# Patient Record
Sex: Male | Born: 1950
Health system: Southern US, Community
[De-identification: ages and names within clinical notes are randomized; demographics above are authoritative.]

## PROBLEM LIST (undated history)

## (undated) DIAGNOSIS — D126 Benign neoplasm of colon, unspecified: Secondary | ICD-10-CM

## (undated) DIAGNOSIS — E785 Hyperlipidemia, unspecified: Secondary | ICD-10-CM

## (undated) DIAGNOSIS — K2980 Duodenitis without bleeding: Secondary | ICD-10-CM

## (undated) DIAGNOSIS — F329 Major depressive disorder, single episode, unspecified: Secondary | ICD-10-CM

## (undated) DIAGNOSIS — F32A Depression, unspecified: Secondary | ICD-10-CM

## (undated) DIAGNOSIS — J45909 Unspecified asthma, uncomplicated: Secondary | ICD-10-CM

## (undated) DIAGNOSIS — Z9889 Other specified postprocedural states: Secondary | ICD-10-CM

## (undated) DIAGNOSIS — C4441 Basal cell carcinoma of skin of scalp and neck: Secondary | ICD-10-CM

## (undated) DIAGNOSIS — C61 Malignant neoplasm of prostate: Secondary | ICD-10-CM

## (undated) DIAGNOSIS — K449 Diaphragmatic hernia without obstruction or gangrene: Secondary | ICD-10-CM

## (undated) DIAGNOSIS — T7840XA Allergy, unspecified, initial encounter: Secondary | ICD-10-CM

## (undated) DIAGNOSIS — K222 Esophageal obstruction: Secondary | ICD-10-CM

## (undated) DIAGNOSIS — K5790 Diverticulosis of intestine, part unspecified, without perforation or abscess without bleeding: Secondary | ICD-10-CM

## (undated) DIAGNOSIS — Z8719 Personal history of other diseases of the digestive system: Secondary | ICD-10-CM

## (undated) HISTORY — PX: COLONOSCOPY: SHX174

## (undated) HISTORY — DX: Diaphragmatic hernia without obstruction or gangrene: K44.9

## (undated) HISTORY — DX: Esophageal obstruction: K22.2

## (undated) HISTORY — DX: Hyperlipidemia, unspecified: E78.5

## (undated) HISTORY — PX: TONSILLECTOMY: SUR1361

## (undated) HISTORY — DX: Unspecified asthma, uncomplicated: J45.909

## (undated) HISTORY — DX: Duodenitis without bleeding: K29.80

## (undated) HISTORY — DX: Diverticulosis of intestine, part unspecified, without perforation or abscess without bleeding: K57.90

## (undated) HISTORY — DX: Basal cell carcinoma of skin of scalp and neck: C44.41

## (undated) HISTORY — DX: Depression, unspecified: F32.A

## (undated) HISTORY — DX: Personal history of other diseases of the digestive system: Z87.19

## (undated) HISTORY — DX: Benign neoplasm of colon, unspecified: D12.6

## (undated) HISTORY — DX: Major depressive disorder, single episode, unspecified: F32.9

## (undated) HISTORY — DX: Other specified postprocedural states: Z98.890

## (undated) HISTORY — DX: Malignant neoplasm of prostate: C61

## (undated) HISTORY — DX: Allergy, unspecified, initial encounter: T78.40XA

---

## 2000-02-27 ENCOUNTER — Encounter: Payer: Self-pay | Admitting: Family Medicine

## 2000-02-27 ENCOUNTER — Encounter: Admission: RE | Admit: 2000-02-27 | Discharge: 2000-02-27 | Payer: Self-pay | Admitting: Family Medicine

## 2000-07-16 ENCOUNTER — Encounter: Payer: Self-pay | Admitting: Family Medicine

## 2000-07-16 ENCOUNTER — Encounter: Admission: RE | Admit: 2000-07-16 | Discharge: 2000-07-16 | Payer: Self-pay | Admitting: Family Medicine

## 2009-06-29 DIAGNOSIS — C61 Malignant neoplasm of prostate: Secondary | ICD-10-CM

## 2009-06-29 HISTORY — DX: Malignant neoplasm of prostate: C61

## 2009-07-30 HISTORY — PX: PROSTATECTOMY: SHX69

## 2013-04-24 ENCOUNTER — Ambulatory Visit (INDEPENDENT_AMBULATORY_CARE_PROVIDER_SITE_OTHER): Payer: BC Managed Care – PPO

## 2013-04-24 DIAGNOSIS — Z23 Encounter for immunization: Secondary | ICD-10-CM

## 2013-05-18 ENCOUNTER — Telehealth: Payer: Self-pay | Admitting: *Deleted

## 2013-05-18 NOTE — Telephone Encounter (Signed)
Pt aware that b12 levels have to be drawn and he will decide if he wants to do it or not and he will call us back

## 2013-07-12 ENCOUNTER — Ambulatory Visit (INDEPENDENT_AMBULATORY_CARE_PROVIDER_SITE_OTHER): Payer: BC Managed Care – PPO

## 2013-07-12 ENCOUNTER — Ambulatory Visit (INDEPENDENT_AMBULATORY_CARE_PROVIDER_SITE_OTHER): Payer: BC Managed Care – PPO | Admitting: Family Medicine

## 2013-07-12 ENCOUNTER — Encounter: Payer: Self-pay | Admitting: Family Medicine

## 2013-07-12 VITALS — BP 123/80 | HR 63 | Temp 97.4°F | Ht 73.0 in | Wt 211.0 lb

## 2013-07-12 DIAGNOSIS — F329 Major depressive disorder, single episode, unspecified: Secondary | ICD-10-CM

## 2013-07-12 DIAGNOSIS — F339 Major depressive disorder, recurrent, unspecified: Secondary | ICD-10-CM | POA: Insufficient documentation

## 2013-07-12 DIAGNOSIS — Z0000 Encounter for general adult medical examination without abnormal findings: Secondary | ICD-10-CM

## 2013-07-12 DIAGNOSIS — R030 Elevated blood-pressure reading, without diagnosis of hypertension: Secondary | ICD-10-CM | POA: Insufficient documentation

## 2013-07-12 DIAGNOSIS — F32A Depression, unspecified: Secondary | ICD-10-CM

## 2013-07-12 DIAGNOSIS — Z801 Family history of malignant neoplasm of trachea, bronchus and lung: Secondary | ICD-10-CM

## 2013-07-12 DIAGNOSIS — E785 Hyperlipidemia, unspecified: Secondary | ICD-10-CM | POA: Insufficient documentation

## 2013-07-12 DIAGNOSIS — IMO0001 Reserved for inherently not codable concepts without codable children: Secondary | ICD-10-CM

## 2013-07-12 DIAGNOSIS — Z8546 Personal history of malignant neoplasm of prostate: Secondary | ICD-10-CM | POA: Insufficient documentation

## 2013-07-12 NOTE — Patient Instructions (Addendum)
Continue current medications. Continue good therapeutic lifestyle changes which include good diet and exercise. Fall precautions discussed with patient. Schedule your flu vaccine if you haven't had it yet If you are over 62 years old - you may need Prevnar 54 or the adult Pneumonia vaccine. Check on the cost of Zostavx (shingles shot) Also, check on the cost of Prevnar vaccine Continue with your exercise and diet habits Followup with the urologist as planned and come to this office and get your PSA prior to that visit

## 2013-07-12 NOTE — Progress Notes (Signed)
Subjective:    Patient ID: Spencer White, male    DOB: 1951-03-06, 63 y.o.   MRN: [id number]  HPI Patient here today for complete physical. This patient is doing well no specific complaints. He has a history of prostate cancer and will followup with the urologist in 4 months. He will return to get his blood work done a but the PSA will be relegated to the urologist. He will check with his insurance regarding the Prevnar. He will also be given an FOBT to return. We will do a chest x-ray today. His family history is positive and his brother and mother for lung cancer.       There are no active problems to display for this patient.  Outpatient Encounter Prescriptions as of 07/12/2013  Medication Sig  . venlafaxine XR (EFFEXOR-XR) 75 MG 24 hr capsule Take 75 mg by mouth daily with breakfast.    Review of Systems  Constitutional: Negative.   HENT: Negative.   Eyes: Negative.   Respiratory: Negative.   Cardiovascular: Negative.   Gastrointestinal: Negative.   Endocrine: Negative.   Genitourinary: Negative.   Musculoskeletal: Negative.   Skin: Negative.   Allergic/Immunologic: Negative.   Neurological: Negative.   Hematological: Negative.   Psychiatric/Behavioral: Negative.        Objective:   Physical Exam  Nursing note and vitals reviewed. Constitutional: He is oriented to person, place, and time. He appears well-developed and well-nourished. No distress.  HENT:  Head: Normocephalic and atraumatic.  Right Ear: External ear normal.  Left Ear: External ear normal.  Nose: Nose normal.  Mouth/Throat: Oropharynx is clear and moist. No oropharyngeal exudate.  Eyes: Conjunctivae and EOM are normal. Pupils are equal, round, and reactive to light. Right eye exhibits no discharge. Left eye exhibits no discharge. No scleral icterus.  Neck: Normal range of motion. Neck supple. No tracheal deviation present. No thyromegaly present.  Cardiovascular: Normal rate, regular rhythm, normal  heart sounds and intact distal pulses.  Exam reveals no gallop and no friction rub.   No murmur heard. At 72 per minute  Pulmonary/Chest: Effort normal and breath sounds normal. No respiratory distress. He has no wheezes. He has no rales. He exhibits no tenderness.  Abdominal: Soft. Bowel sounds are normal. He exhibits no mass. There is no tenderness. There is no rebound and no guarding.  Genitourinary: Rectum normal and penis normal.  The prostate is missing secondary to a radical prostatectomy. Prostate vault is negative for any lumps or masses. Rectal exam is negative for any masses. The external genitalia are normal and the testicles are normal and there is no inguinal hernia present.  Musculoskeletal: Normal range of motion. He exhibits no edema and no tenderness.  Lymphadenopathy:    He has no cervical adenopathy.  Neurological: He is alert and oriented to person, place, and time. He has normal reflexes. No cranial nerve deficit.  Skin: Skin is warm and dry. No rash noted. No erythema. No pallor.  Psychiatric: He has a normal mood and affect. His behavior is normal. Judgment and thought content normal.   BP 123/80  Pulse 63  Temp(Src) 97.4 F (36.3 C) (Oral)  Ht '6\' 1"'  (1.854 m)  Wt 211 lb (95.709 kg)  BMI 27.84 kg/m2  [facility] reading (PRIMARY) by  Dr. Brunilda Payor x-ray --no active disease  Assessment & Plan:  1. Annual physical exam - DG Chest 2 View; Future - POCT CBC; Future - POCT UA - Microscopic Only - POCT urinalysis dipstick - BMP8+EGFR; Future - Hepatic function panel; Future - NMR, lipoprofile; Future - Thyroid Panel With TSH; Future - Vit D  25 hydroxy (rtn osteoporosis monitoring); Future - CT Chest Wo Contrast; Future  2. History of prostate cancer - POCT CBC; Future - CT Chest Wo Contrast; Future  3. Elevated BP - POCT CBC; Future - BMP8+EGFR; Future - Hepatic function panel; Future  4. Hyperlipidemia - POCT CBC;  Future - BMP8+EGFR; Future - Hepatic function panel; Future - NMR, lipoprofile; Future  5. Depression -Continue with medication  6. Blood pressure elevated without history of HTN  7. Family history of lung cancer - CT Chest Wo Contrast; Future  Patient Instructions  Continue current medications. Continue good therapeutic lifestyle changes which include good diet and exercise. Fall precautions discussed with patient. Schedule your flu vaccine if you haven't had it yet If you are over 2 years old - you may need Prevnar 110 or the adult Pneumonia vaccine. Check on the cost of Zostavx (shingles shot) Also, check on the cost of Prevnar vaccine Continue with your exercise and diet habits Followup with the urologist as planned and come to this office and get your PSA prior to that visit   Arrie Senate MD

## 2013-07-13 ENCOUNTER — Other Ambulatory Visit: Payer: BC Managed Care – PPO

## 2013-07-13 DIAGNOSIS — Z0000 Encounter for general adult medical examination without abnormal findings: Secondary | ICD-10-CM

## 2013-07-13 DIAGNOSIS — R030 Elevated blood-pressure reading, without diagnosis of hypertension: Secondary | ICD-10-CM

## 2013-07-13 DIAGNOSIS — E785 Hyperlipidemia, unspecified: Secondary | ICD-10-CM

## 2013-07-13 DIAGNOSIS — IMO0001 Reserved for inherently not codable concepts without codable children: Secondary | ICD-10-CM

## 2013-07-27 LAB — BMP8+EGFR
BUN / CREAT RATIO: 9 — AB (ref 10–22)
BUN: 11 mg/dL (ref 8–27)
CHLORIDE: 104 mmol/L (ref 97–108)
CO2: 20 mmol/L (ref 18–29)
Calcium: 8.9 mg/dL (ref 8.6–10.2)
Creatinine, Ser: 1.18 mg/dL (ref 0.76–1.27)
GFR calc Af Amer: 77 mL/min/{1.73_m2} (ref >59)
GFR calc non Af Amer: 66 mL/min/{1.73_m2} (ref >59)
Glucose: 87 mg/dL (ref 65–99)
POTASSIUM: 4.5 mmol/L (ref 3.5–5.2)
SODIUM: 142 mmol/L (ref 134–144)

## 2013-07-27 LAB — HEPATIC FUNCTION PANEL
ALK PHOS: 65 IU/L (ref 39–117)
ALT: 20 IU/L (ref 0–44)
AST: 19 IU/L (ref 0–40)
Albumin: 3.8 g/dL (ref 3.6–4.8)
Bilirubin, Direct: 0.13 mg/dL (ref 0.00–0.40)
Total Bilirubin: 0.6 mg/dL (ref 0.0–1.2)
Total Protein: 5.9 g/dL — ABNORMAL LOW (ref 6.0–8.5)

## 2013-07-27 LAB — NMR, LIPOPROFILE

## 2013-07-27 LAB — THYROID PANEL WITH TSH
Free Thyroxine Index: 1.9 (ref 1.2–4.9)
T3 Uptake Ratio: 31 % (ref 24–39)
T4, Total: 6.1 ug/dL (ref 4.5–12.0)
TSH: 2.76 u[IU]/mL (ref 0.450–4.500)

## 2013-07-27 LAB — VITAMIN D 25 HYDROXY (VIT D DEFICIENCY, FRACTURES): VIT D 25 HYDROXY: 41.8 ng/mL (ref 30.0–100.0)

## 2013-07-28 ENCOUNTER — Telehealth: Payer: Self-pay | Admitting: Family Medicine

## 2013-07-28 NOTE — Telephone Encounter (Signed)
Message copied by Waverly Ferrari on Fri Jul 28, 2013 11:45 AM ------      Message from: Chipper Herb      Created: Thu Jul 27, 2013  1:59 PM       Blood sugar and renal function including electrolytes are within normal limits      All liver function tests are within normal limits      All thyroid function tests are within normal limits      The vitamin D level is good, could be a little bit higher. Increase vitamin D3 by 1000 daily      LAB------ did this patient did advanced lipid testing?       ------

## 2013-07-28 NOTE — Telephone Encounter (Signed)
Taken care of in result note  

## 2013-08-02 ENCOUNTER — Other Ambulatory Visit: Payer: BC Managed Care – PPO

## 2013-08-02 DIAGNOSIS — Z1212 Encounter for screening for malignant neoplasm of rectum: Secondary | ICD-10-CM

## 2013-08-02 NOTE — Progress Notes (Signed)
Patient dropped off fobt 

## 2013-08-03 LAB — FECAL OCCULT BLOOD, IMMUNOCHEMICAL: FECAL OCCULT BLD: NEGATIVE

## 2013-08-09 ENCOUNTER — Telehealth: Payer: Self-pay | Admitting: Family Medicine

## 2013-08-16 NOTE — Telephone Encounter (Signed)
Sent to lab to review

## 2013-08-28 ENCOUNTER — Encounter: Payer: Self-pay | Admitting: *Deleted

## 2013-09-06 LAB — NMR, LIPOPROFILE
Cholesterol: 175 mg/dL (ref <200)
HDL CHOLESTEROL BY NMR: 43 mg/dL (ref >=40)
HDL Particle Number: 30.1 umol/L — ABNORMAL LOW (ref >=30.5)
LDL Particle Number: 1619 nmol/L — ABNORMAL HIGH (ref <1000)
LDL Size: 20.6 nm (ref >20.5)
LDLC SERPL CALC-MCNC: 119 mg/dL — AB (ref <100)
LP-IR Score: 46 — ABNORMAL HIGH (ref <=45)
Small LDL Particle Number: 694 nmol/L — ABNORMAL HIGH (ref <=527)
Triglycerides by NMR: 64 mg/dL (ref <150)

## 2013-10-11 ENCOUNTER — Telehealth: Payer: Self-pay | Admitting: Family Medicine

## 2013-10-11 NOTE — Telephone Encounter (Signed)
PAtient aware of chest Xray results

## 2013-10-19 ENCOUNTER — Encounter: Payer: Self-pay | Admitting: *Deleted

## 2013-11-14 ENCOUNTER — Other Ambulatory Visit (INDEPENDENT_AMBULATORY_CARE_PROVIDER_SITE_OTHER): Payer: BC Managed Care – PPO

## 2013-11-14 DIAGNOSIS — C61 Malignant neoplasm of prostate: Secondary | ICD-10-CM

## 2013-11-14 NOTE — Progress Notes (Signed)
Pt came in for labs only 

## 2013-11-15 LAB — PSA, TOTAL AND FREE
PSA, Free Pct: >10 %
PSA, Free: 0.01 ng/mL
PSA: <0.1 ng/mL (ref 0.0–4.0)

## 2013-11-29 ENCOUNTER — Telehealth: Payer: Self-pay | Admitting: Family Medicine

## 2013-11-30 NOTE — Telephone Encounter (Signed)
Pt aware of # and that lab was faxed

## 2014-01-10 ENCOUNTER — Ambulatory Visit: Payer: BC Managed Care – PPO | Admitting: Family Medicine

## 2014-01-11 ENCOUNTER — Ambulatory Visit: Payer: BC Managed Care – PPO | Admitting: Family Medicine

## 2014-01-12 ENCOUNTER — Ambulatory Visit: Payer: BC Managed Care – PPO | Admitting: Family Medicine

## 2014-03-28 DIAGNOSIS — N529 Male erectile dysfunction, unspecified: Secondary | ICD-10-CM | POA: Insufficient documentation

## 2014-03-28 DIAGNOSIS — C61 Malignant neoplasm of prostate: Secondary | ICD-10-CM | POA: Insufficient documentation

## 2014-03-28 DIAGNOSIS — F419 Anxiety disorder, unspecified: Secondary | ICD-10-CM | POA: Insufficient documentation

## 2014-03-28 DIAGNOSIS — F411 Generalized anxiety disorder: Secondary | ICD-10-CM | POA: Insufficient documentation

## 2014-07-13 ENCOUNTER — Ambulatory Visit (INDEPENDENT_AMBULATORY_CARE_PROVIDER_SITE_OTHER): Payer: BLUE CROSS/BLUE SHIELD | Admitting: *Deleted

## 2014-07-13 DIAGNOSIS — Z23 Encounter for immunization: Secondary | ICD-10-CM

## 2014-07-24 ENCOUNTER — Encounter: Payer: Self-pay | Admitting: Family Medicine

## 2014-07-24 ENCOUNTER — Ambulatory Visit (INDEPENDENT_AMBULATORY_CARE_PROVIDER_SITE_OTHER): Payer: BLUE CROSS/BLUE SHIELD | Admitting: Family Medicine

## 2014-07-24 VITALS — BP 127/76 | HR 60 | Temp 98.5°F | Ht 73.0 in | Wt 219.0 lb

## 2014-07-24 DIAGNOSIS — F329 Major depressive disorder, single episode, unspecified: Secondary | ICD-10-CM

## 2014-07-24 DIAGNOSIS — E785 Hyperlipidemia, unspecified: Secondary | ICD-10-CM

## 2014-07-24 DIAGNOSIS — R030 Elevated blood-pressure reading, without diagnosis of hypertension: Secondary | ICD-10-CM

## 2014-07-24 DIAGNOSIS — Z8546 Personal history of malignant neoplasm of prostate: Secondary | ICD-10-CM

## 2014-07-24 DIAGNOSIS — Z801 Family history of malignant neoplasm of trachea, bronchus and lung: Secondary | ICD-10-CM

## 2014-07-24 DIAGNOSIS — F32A Depression, unspecified: Secondary | ICD-10-CM

## 2014-07-24 DIAGNOSIS — R1310 Dysphagia, unspecified: Secondary | ICD-10-CM

## 2014-07-24 DIAGNOSIS — Z0000 Encounter for general adult medical examination without abnormal findings: Secondary | ICD-10-CM

## 2014-07-24 LAB — POCT CBC
Granulocyte percent: 71.6 %G (ref 37–80)
HEMATOCRIT: 45.4 % (ref 43.5–53.7)
HEMOGLOBIN: 15.1 g/dL (ref 14.1–18.1)
Lymph, poc: 1.6 (ref 0.6–3.4)
MCH, POC: 32.4 pg — AB (ref 27–31.2)
MCHC: 33.3 g/dL (ref 31.8–35.4)
MCV: 97.2 fL — AB (ref 80–97)
MPV: 8.4 fL (ref 0–99.8)
PLATELET COUNT, POC: 273 10*3/uL (ref 142–424)
POC Granulocyte: 4.2 (ref 2–6.9)
POC LYMPH %: 26.6 % (ref 10–50)
RBC: 4.7 M/uL (ref 4.69–6.13)
RDW, POC: 12.8 %
WBC: 5.9 10*3/uL (ref 4.6–10.2)

## 2014-07-24 LAB — POCT URINALYSIS DIPSTICK
Bilirubin, UA: NEGATIVE
Blood, UA: NEGATIVE
Glucose, UA: NEGATIVE
Ketones, UA: NEGATIVE
Leukocytes, UA: NEGATIVE
NITRITE UA: NEGATIVE
PROTEIN UA: NEGATIVE
SPEC GRAV UA: 1.01
Urobilinogen, UA: NEGATIVE
pH, UA: 7

## 2014-07-24 LAB — POCT UA - MICROSCOPIC ONLY
Bacteria, U Microscopic: NEGATIVE
CASTS, UR, LPF, POC: NEGATIVE
Crystals, Ur, HPF, POC: NEGATIVE
Mucus, UA: NEGATIVE
RBC, URINE, MICROSCOPIC: NEGATIVE
WBC, Ur, HPF, POC: NEGATIVE
Yeast, UA: NEGATIVE

## 2014-07-24 NOTE — Progress Notes (Signed)
Subjective:    Patient ID: Spencer White, male    DOB: 1950/11/24, 64 y.o.   MRN: [id number]  HPI Patient is here today for annual wellness exam and follow up of chronic medical problems which include depression and hyperlipidemia. He is taking medication regularly. History-wise is important to note that Dr. Jonette Eva did his robotic prostatectomy. He indicates that Dr. [place] Crane wanted [place] to do his PSA and prostate or rectal exam and send a copy of the PSA to him. The patient indicates that post-prostatectomy that he is doing well with bladder control and he still has early morning erections so apparently nerve sparing is good. He is requesting that we refill his Effexor for his depression and he does complain of food getting stuck in his esophagus at times. This is becoming worse over the past 6 months with more frequency of food getting on in his esophagus. It has been going on for a couple of years. The weight is up 8 pounds since his last visit. That was one year ago.       Patient Active Problem List   Diagnosis Date Noted  . History of prostate cancer 07/12/2013  . Depression 07/12/2013  . Hyperlipidemia 07/12/2013  . Blood pressure elevated without history of HTN 07/12/2013   Outpatient Encounter Prescriptions as of 07/24/2014  Medication Sig  . venlafaxine XR (EFFEXOR-XR) 75 MG 24 hr capsule Take 75 mg by mouth daily with breakfast.    Review of Systems  Constitutional: Negative.   HENT: Negative.   Eyes: Negative.   Respiratory: Negative.   Cardiovascular: Negative.   Gastrointestinal: Negative.        Feels like food gets "stuck"  Endocrine: Negative.   Genitourinary: Negative.   Musculoskeletal: Negative.   Skin: Negative.   Allergic/Immunologic: Negative.   Neurological: Negative.   Hematological: Negative.   Psychiatric/Behavioral: Negative.        Objective:   Physical Exam  Constitutional: He is oriented to person, place, and time. He appears well-developed  and well-nourished. No distress.  HENT:  Head: Normocephalic and atraumatic.  Right Ear: External ear normal.  Left Ear: External ear normal.  Mouth/Throat: Oropharynx is clear and moist. No oropharyngeal exudate.  The right side of the nostril has some bleeding and irritation and the patient thinks is from the dry heat.  Eyes: Conjunctivae and EOM are normal. Pupils are equal, round, and reactive to light. Right eye exhibits no discharge. Left eye exhibits no discharge. No scleral icterus.  His had a recent eye exam and indicates the ophthalmologist said his vision had actually improved.  Neck: Normal range of motion. Neck supple. No thyromegaly present.  No carotid bruits thyromegaly or anterior cervical adenopathy  Cardiovascular: Normal rate, regular rhythm, normal heart sounds and intact distal pulses.  Exam reveals no gallop and no friction rub.   No murmur heard. The rhythm was regular at 60/m  Pulmonary/Chest: Effort normal and breath sounds normal. No respiratory distress. He has no wheezes. He has no rales. He exhibits no tenderness.  Clear anteriorly and posteriorly and no axillary adenopathy  Abdominal: Soft. Bowel sounds are normal. He exhibits no mass. There is no tenderness. There is no rebound and no guarding.  The abdomen was nontender. There was no inguinal adenopathy. There is no suprapubic tenderness.  Genitourinary: Rectum normal and penis normal.  The prostate vault was empty without any irregularities. The rectum was negative on exam. External genitalia were normal and there was no hernia  palpated on either side.  Musculoskeletal: Normal range of motion. He exhibits no edema or tenderness.  Lymphadenopathy:    He has no cervical adenopathy.  Neurological: He is alert and oriented to person, place, and time. He has normal reflexes. No cranial nerve deficit.  Skin: Skin is warm and dry. No rash noted. No erythema. No pallor.  Psychiatric: He has a normal mood and affect.  His behavior is normal. Judgment and thought content normal.  Nursing note and vitals reviewed.  BP 127/76 mmHg  Pulse 60  Temp(Src) 98.5 F (36.9 C) (Oral)  Ht '6\' 1"'  (1.854 m)  Wt 219 lb (99.338 kg)  BMI 28.90 kg/m2        Assessment & Plan:  1. Annual physical exam -We are going to try to arrange to get a screening CT of his lung status because of the strong family history of cancer in the lung which includes his mother brother. - POCT CBC - POCT UA - Microscopic Only - POCT urinalysis dipstick - BMP8+EGFR - Hepatic function panel - NMR, lipoprofile - PSA, total and free - Vit D  25 hydroxy (rtn osteoporosis monitoring) - Thyroid Panel With TSH - EKG 12-Lead  2. History of prostate cancer -Patient has had a robotic prostatectomy and the urologist has given [place] the okay to do his annual rectal exam and PSA is in a copy of the report to him. - POCT CBC - PSA, total and free  3. Hyperlipidemia -Continue with aggressive therapeutic lifestyle changes which include diet and exercise and any treatment will be recommended pending lab results that have not been returned - POCT CBC - NMR, lipoprofile  4. Family history of lung cancer - POCT CBC - CT CHEST LOW DOSE SCREENING W/O CM; Future  5. Blood pressure elevated without history of HTN -Blood pressures are good today and patient should continue watching sodium - POCT CBC - BMP8+EGFR - Hepatic function panel - EKG 12-Lead  6. Depression -The patient is doing well with this and we will continue to refill his Effexor  No orders of the defined types were placed in this encounter.   Patient Instructions  Continue current medications. Continue good therapeutic lifestyle changes which include good diet and exercise. Fall precautions discussed with patient. If an FOBT was given today- please return it to our front desk. If you are over 18 years old - you may need Prevnar 85 or the adult Pneumonia vaccine.  Flu Shots  will be available at our office starting mid- September. Please call and schedule a FLU CLINIC APPOINTMENT.   Please check with your insurance regarding the shingles vaccine or a Zostavax and the Prevnar vaccine which is a pneumonia shot is recommended at age 73 We will try to get a screening CT of the chest because of your strong family history for lung cancer. We will also arrange for you to have an endoscopy because of the difficulty swallowing and a colonoscopy because that is due to is been 10 years according to your history.   Arrie Senate MD

## 2014-07-24 NOTE — Patient Instructions (Addendum)
Continue current medications. Continue good therapeutic lifestyle changes which include good diet and exercise. Fall precautions discussed with patient. If an FOBT was given today- please return it to our front desk. If you are over 63 years old - you may need Prevnar 74 or the adult Pneumonia vaccine.  Flu Shots will be available at our office starting mid- September. Please call and schedule a FLU CLINIC APPOINTMENT.   Please check with your insurance regarding the shingles vaccine or a Zostavax and the Prevnar vaccine which is a pneumonia shot is recommended at age 64 We will try to get a screening CT of the chest because of your strong family history for lung cancer. We will also arrange for you to have an endoscopy because of the difficulty swallowing and a colonoscopy because that is due to is been 10 years according to your history.

## 2014-07-24 NOTE — Addendum Note (Signed)
Addended by: Zannie Cove on: 07/24/2014 12:17 PM   Modules accepted: Orders

## 2014-07-25 ENCOUNTER — Encounter: Payer: Self-pay | Admitting: Internal Medicine

## 2014-07-25 LAB — BMP8+EGFR
BUN / CREAT RATIO: 10 (ref 10–22)
BUN: 13 mg/dL (ref 8–27)
CO2: 26 mmol/L (ref 18–29)
Calcium: 9 mg/dL (ref 8.6–10.2)
Chloride: 100 mmol/L (ref 97–108)
Creatinine, Ser: 1.28 mg/dL — ABNORMAL HIGH (ref 0.76–1.27)
GFR calc Af Amer: 69 mL/min/{1.73_m2} (ref >59)
GFR calc non Af Amer: 60 mL/min/{1.73_m2} (ref >59)
GLUCOSE: 98 mg/dL (ref 65–99)
Potassium: 4.3 mmol/L (ref 3.5–5.2)
Sodium: 139 mmol/L (ref 134–144)

## 2014-07-25 LAB — THYROID PANEL WITH TSH
Free Thyroxine Index: 2.2 (ref 1.2–4.9)
T3 Uptake Ratio: 32 % (ref 24–39)
T4 TOTAL: 6.8 ug/dL (ref 4.5–12.0)
TSH: 1.95 u[IU]/mL (ref 0.450–4.500)

## 2014-07-25 LAB — HEPATIC FUNCTION PANEL
ALBUMIN: 4.1 g/dL (ref 3.6–4.8)
ALT: 26 IU/L (ref 0–44)
AST: 24 IU/L (ref 0–40)
Alkaline Phosphatase: 54 IU/L (ref 39–117)
BILIRUBIN DIRECT: 0.15 mg/dL (ref 0.00–0.40)
Total Bilirubin: 0.7 mg/dL (ref 0.0–1.2)
Total Protein: 6.2 g/dL (ref 6.0–8.5)

## 2014-07-25 LAB — PSA, TOTAL AND FREE
PSA FREE: 0.01 ng/mL
PSA, Free Pct: >10 %
PSA: <0.1 ng/mL (ref 0.0–4.0)

## 2014-07-25 LAB — NMR, LIPOPROFILE
CHOLESTEROL: 206 mg/dL — AB (ref 100–199)
HDL CHOLESTEROL BY NMR: 42 mg/dL (ref >39)
HDL Particle Number: 30.8 umol/L (ref >=30.5)
LDL Particle Number: 1852 nmol/L — ABNORMAL HIGH (ref <1000)
LDL Size: 20.5 nm (ref >20.5)
LDL-C: 149 mg/dL — AB (ref 0–99)
LP-IR Score: 44 (ref <=45)
Small LDL Particle Number: 949 nmol/L — ABNORMAL HIGH (ref <=527)
Triglycerides by NMR: 74 mg/dL (ref 0–149)

## 2014-07-25 LAB — VITAMIN D 25 HYDROXY (VIT D DEFICIENCY, FRACTURES): Vit D, 25-Hydroxy: 42.6 ng/mL (ref 30.0–100.0)

## 2014-07-26 NOTE — Progress Notes (Signed)
Pt aware and PSA result sent to [facility], results printed and mailed to pt.

## 2014-08-30 ENCOUNTER — Other Ambulatory Visit: Payer: Self-pay | Admitting: Family Medicine

## 2014-08-30 DIAGNOSIS — Z801 Family history of malignant neoplasm of trachea, bronchus and lung: Secondary | ICD-10-CM

## 2014-08-31 ENCOUNTER — Inpatient Hospital Stay (HOSPITAL_COMMUNITY): Admission: RE | Admit: 2014-08-31 | Payer: Self-pay | Source: Ambulatory Visit

## 2014-08-31 ENCOUNTER — Ambulatory Visit (HOSPITAL_COMMUNITY)
Admission: RE | Admit: 2014-08-31 | Discharge: 2014-08-31 | Disposition: A | Discharge status: Home / Self Care | Payer: BLUE CROSS/BLUE SHIELD | Source: Ambulatory Visit | Attending: Family Medicine | Admitting: Family Medicine

## 2014-08-31 DIAGNOSIS — R918 Other nonspecific abnormal finding of lung field: Secondary | ICD-10-CM | POA: Insufficient documentation

## 2014-08-31 DIAGNOSIS — Z801 Family history of malignant neoplasm of trachea, bronchus and lung: Secondary | ICD-10-CM | POA: Diagnosis present

## 2014-08-31 DIAGNOSIS — I7090 Unspecified atherosclerosis: Secondary | ICD-10-CM | POA: Diagnosis not present

## 2014-09-06 ENCOUNTER — Encounter: Payer: Self-pay | Admitting: *Deleted

## 2014-09-18 ENCOUNTER — Ambulatory Visit (INDEPENDENT_AMBULATORY_CARE_PROVIDER_SITE_OTHER): Payer: BLUE CROSS/BLUE SHIELD | Admitting: Internal Medicine

## 2014-09-18 ENCOUNTER — Encounter: Payer: Self-pay | Admitting: Internal Medicine

## 2014-09-18 VITALS — BP 128/70 | HR 64 | Ht 71.75 in | Wt 219.2 lb

## 2014-09-18 DIAGNOSIS — R1310 Dysphagia, unspecified: Secondary | ICD-10-CM

## 2014-09-18 DIAGNOSIS — Z1211 Encounter for screening for malignant neoplasm of colon: Secondary | ICD-10-CM

## 2014-09-18 NOTE — Progress Notes (Signed)
Of note: We attempted to contact Dr Johnsie Kindred' office in [place], [place] to get a copy of patient's previous colonoscopy. Unfortunately, all phone numbers that are connected with this physician are non working numbers.

## 2014-09-18 NOTE — Patient Instructions (Signed)

## 2014-09-18 NOTE — Progress Notes (Signed)
Patient ID: Spencer White, male   DOB: 10/17/1950, 64 y.o.   MRN: [id number] HPI: Spencer White is a 64 year old male with past medical history of prostate cancer status post total prostatectomy, hypertension, hyperlipidemia who is seen in consultation at the request of Dr. Laurance Flatten to evaluate dysphagia and for colorectal cancer screening. He is here alone today. He reports overall he feels well. He has noticed over the last several months to maybe a year very occasional trouble swallowing solid food. This is most noticeable with foods such as baked chicken and pork. He feels food stick in his mid chest. This most the time passes on its own but occasionally he has had to vomit the food up. He denies odynophagia. Reports he has never had heartburn symptoms. No nausea, vomiting, early satiety or weight loss. He recalls his brother having a similar issue and needed his esophagus dilated. He has never had an upper endoscopy. Reports no abdominal pain. Bowel habits are regular and normal for him. No diarrhea or constipation. No blood in his stool or melena. No family history of colorectal cancer. He had a colonoscopy performed 10 years ago by Dr. Johnsie Kindred in [place]. He recalls this being normal. 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  Past Medical History  Diagnosis Date  . Prostate cancer 2011  . Hyperlipidemia   . Hypertension   . BCC (basal cell carcinoma), scalp/neck   . Depression   . Asthma   . Status post dilation of esophageal narrowing     ?maybe    Past Surgical History  Procedure Laterality Date  . Tonsillectomy    . Prostatectomy    . Prostatectomy  07/2009    Outpatient Prescriptions Prior to Visit  Medication Sig Dispense Refill  . venlafaxine XR (EFFEXOR-XR) 75 MG 24 hr capsule Take 75 mg by mouth daily with breakfast.     No facility-administered medications prior to visit.    Allergies  Allergen Reactions  . Latex Other (See Comments)    Gets  redness from band-aids    Family History  Problem Relation Age of Onset  . Lung cancer Mother   . Hypertension Father   . Lung cancer Brother   . Colon cancer Neg Hx   . Colon polyps Neg Hx   . Kidney disease Neg Hx   . Gallbladder disease Neg Hx   . Esophageal cancer Neg Hx   . Diabetes Neg Hx   . Heart disease Father     Angioplasty stent  . Hypertension Father   . Hypertension Brother     History  Substance Use Topics  . Smoking status: Never Smoker   . Smokeless tobacco: Never Used  . Alcohol Use: 0.0 oz/week    0 Standard drinks or equivalent per week     Comment: 1 glass red wine 1-2 a week    ROS: As per history of present illness, otherwise negative  BP 128/70 mmHg  Pulse 64  Ht 5' 11.75" (1.822 m)  Wt 219 lb 4 oz (99.451 kg)  BMI 29.96 kg/m2 Constitutional: Well-developed and well-nourished. No distress. HEENT: Normocephalic and atraumatic. Oropharynx is clear and moist. No oropharyngeal exudate. Conjunctivae are normal.  No scleral icterus. Neck: Neck supple. Trachea midline. Cardiovascular: Normal rate, regular rhythm and intact distal pulses. No M/R/G Pulmonary/chest: Effort normal and breath sounds normal. No wheezing, rales or rhonchi. Abdominal: Soft, nontender, nondistended. Bowel sounds active throughout. There are no masses palpable. No hepatosplenomegaly. Extremities:  no clubbing, cyanosis, or edema Lymphadenopathy: No cervical adenopathy noted. Neurological: Alert and oriented to person place and time. Skin: Skin is warm and dry. No rashes noted. Psychiatric: Normal mood and affect. Behavior is normal.  RELEVANT LABS AND IMAGING: CBC    Component Value Date/Time   WBC 5.9 07/24/2014 1240   RBC 4.7 07/24/2014 1240   HGB 15.1 07/24/2014 1240   HCT 45.4 07/24/2014 1240   MCV 97.2* 07/24/2014 1240   MCH 32.4* 07/24/2014 1240   MCHC 33.3 07/24/2014 1240    CMP     Component Value Date/Time   NA 139 07/24/2014 1138   K 4.3 07/24/2014  1138   CL 100 07/24/2014 1138   CO2 26 07/24/2014 1138   GLUCOSE 98 07/24/2014 1138   BUN 13 07/24/2014 1138   CREATININE 1.28* 07/24/2014 1138   CALCIUM 9.0 07/24/2014 1138   PROT 6.2 07/24/2014 1138   AST 24 07/24/2014 1138   ALT 26 07/24/2014 1138   ALKPHOS 54 07/24/2014 1138   BILITOT 0.7 07/24/2014 1138   GFRNONAA 60 07/24/2014 1138   GFRAA 69 07/24/2014 1138    ASSESSMENT/PLAN: 64 year old male with past medical history of prostate cancer status post total prostatectomy, hypertension, hyperlipidemia who is seen in consultation at the request of Dr. Laurance Flatten to evaluate dysphagia and for colorectal cancer screening.  1. Dysphagia, solids -- his symptoms raise the question of esophageal stricture or Schatzki's ring. I recommended upper endoscopy for diagnosis and also possible dilation. We spent time today discussing the risks, benefits and alternatives and he is agreeable for endoscopy with dilation if necessary. He does not have heartburn symptoms and so I'm not starting a PPI at this time. We talked about the possibility of a mass lesion though given symptoms this is felt very unlikely.  2. Colorectal cancer screening -- colonoscopy for repeat screening, now 10 years after his initial exam. Risks and benefits discussed and he agrees to proceed.      SF:[address] [place], [place] [address]

## 2014-10-10 ENCOUNTER — Encounter: Payer: Self-pay | Admitting: Internal Medicine

## 2014-10-24 ENCOUNTER — Ambulatory Visit (AMBULATORY_SURGERY_CENTER): Payer: BLUE CROSS/BLUE SHIELD | Admitting: Internal Medicine

## 2014-10-24 ENCOUNTER — Encounter: Payer: Self-pay | Admitting: Internal Medicine

## 2014-10-24 VITALS — BP 106/77 | HR 46 | Temp 97.4°F | Resp 19 | Ht 71.5 in | Wt 219.0 lb

## 2014-10-24 DIAGNOSIS — K2990 Gastroduodenitis, unspecified, without bleeding: Secondary | ICD-10-CM

## 2014-10-24 DIAGNOSIS — Z1211 Encounter for screening for malignant neoplasm of colon: Secondary | ICD-10-CM | POA: Diagnosis not present

## 2014-10-24 DIAGNOSIS — D124 Benign neoplasm of descending colon: Secondary | ICD-10-CM

## 2014-10-24 DIAGNOSIS — D123 Benign neoplasm of transverse colon: Secondary | ICD-10-CM | POA: Diagnosis not present

## 2014-10-24 DIAGNOSIS — D120 Benign neoplasm of cecum: Secondary | ICD-10-CM

## 2014-10-24 DIAGNOSIS — K2970 Gastritis, unspecified, without bleeding: Secondary | ICD-10-CM | POA: Diagnosis not present

## 2014-10-24 DIAGNOSIS — R1314 Dysphagia, pharyngoesophageal phase: Secondary | ICD-10-CM

## 2014-10-24 DIAGNOSIS — Q394 Esophageal web: Secondary | ICD-10-CM | POA: Diagnosis not present

## 2014-10-24 DIAGNOSIS — D125 Benign neoplasm of sigmoid colon: Secondary | ICD-10-CM

## 2014-10-24 DIAGNOSIS — D122 Benign neoplasm of ascending colon: Secondary | ICD-10-CM

## 2014-10-24 DIAGNOSIS — K222 Esophageal obstruction: Secondary | ICD-10-CM

## 2014-10-24 MED ORDER — SODIUM CHLORIDE 0.9 % IV SOLN
500.0000 mL | INTRAVENOUS | Status: DC
Start: 2014-10-24 — End: 2014-10-24

## 2014-10-24 MED ORDER — PANTOPRAZOLE SODIUM 40 MG PO TBEC: 40.0000 mg | DELAYED_RELEASE_TABLET | Freq: Every day | ORAL | Status: DC

## 2014-10-24 NOTE — Progress Notes (Signed)
Patient awakening,vss,report to rn 

## 2014-10-24 NOTE — Progress Notes (Signed)
Called to room to assist during endoscopic procedure.  Patient ID and intended procedure confirmed with present staff. Received instructions for my participation in the procedure from the performing physician.  

## 2014-10-24 NOTE — Patient Instructions (Signed)
YOU HAD AN ENDOSCOPIC PROCEDURE TODAY AT [facility]:   Refer to the procedure report that was given to you for any specific questions about what was found during the examination.  If the procedure report does not answer your questions, please call your gastroenterologist to clarify.  If you requested that your care partner not be given the details of your procedure findings, then the procedure report has been included in a sealed envelope for you to review at your convenience later.  YOU SHOULD EXPECT: Some feelings of bloating in the abdomen. Passage of more gas than usual.  Walking can help get rid of the air that was put into your GI tract during the procedure and reduce the bloating. If you had a lower endoscopy (such as a colonoscopy or flexible sigmoidoscopy) you may notice spotting of blood in your stool or on the toilet paper. If you underwent a bowel prep for your procedure, you may not have a normal bowel movement for a few days.  Please Note:  You might notice some irritation and congestion in your nose or some drainage.  This is from the oxygen used during your procedure.  There is no need for concern and it should clear up in a day or so.  SYMPTOMS TO REPORT IMMEDIATELY:   Following lower endoscopy (colonoscopy or flexible sigmoidoscopy):  Excessive amounts of blood in the stool  Significant tenderness or worsening of abdominal pains  Swelling of the abdomen that is new, acute  Fever of 100F or higher   Following upper endoscopy (EGD)  Vomiting of blood or coffee ground material  New chest pain or pain under the shoulder blades  Painful or persistently difficult swallowing  New shortness of breath  Fever of 100F or higher  Black, tarry-looking stools  For urgent or emergent issues, a gastroenterologist can be reached at any hour by calling [phone].   DIET: See dilation diet-  Likewise, meals heavy in dairy and vegetables can increase bloating.  Drink  plenty of fluids but you should avoid alcoholic beverages for 24 hours.  ACTIVITY:  You should plan to take it easy for the rest of today and you should NOT DRIVE or use heavy machinery until tomorrow (because of the sedation medicines used during the test).    FOLLOW UP: Our staff will call the number listed on your records the next business day following your procedure to check on you and address any questions or concerns that you may have regarding the information given to you following your procedure. If we do not reach you, we will leave a message.  However, if you are feeling well and you are not experiencing any problems, there is no need to return our call.  We will assume that you have returned to your regular daily activities without incident.  If any biopsies were taken you will be contacted by phone or by letter within the next 1-3 weeks.  Please call us at [phone] if you have not heard about the biopsies in 3 weeks.    SIGNATURES/CONFIDENTIALITY: You and/or your care partner have signed paperwork which will be entered into your electronic medical record.  These signatures attest to the fact that that the information above on your After Visit Summary has been reviewed and is understood.  Full responsibility of the confidentiality of this discharge information lies with you and/or your care-partner.  Hiatal hernia, polyps, diverticulosis, high fiber diet, dilation diet-handouts given  Wait biospy results.  Dilation as needed.  Begin pantograzole 40 mg daily for 8 weeks.

## 2014-10-24 NOTE — Op Note (Signed)
[name]. [place], [id number]   ENDOSCOPY PROCEDURE REPORT  PATIENT: Spencer White, Spencer White  MR#: [id number] BIRTHDATE: Jun 24, 1951 , 58  yrs. old GENDER: male ENDOSCOPIST: Jerene Bears, MD REFERRED BY:  Redge Gainer, M.D. PROCEDURE DATE:  10/24/2014 PROCEDURE:  EGD w/ biopsy and EGD w/ balloon dilation ASA CLASS:     Class II INDICATIONS:  dysphagia. MEDICATIONS: Monitored anesthesia care and Propofol 250 mg IV TOPICAL ANESTHETIC: none  DESCRIPTION OF PROCEDURE: After the risks benefits and alternatives of the procedure were thoroughly explained, informed consent was obtained.  The LB [id number] endoscope was introduced through the mouth and advanced to the second portion of the duodenum , Without limitations.  The instrument was slowly withdrawn as the mucosa was fully examined.  ESOPHAGUS: A Schatzki ring was found at the gastroesophageal junction.  Using a TTS-balloon the stricture was dilated up to 66mm.  The balloon was held inflated for 60 seconds.  Following this dilation, there was a small mucosal rent.  Initial dilation to 13.5 mm with no endoscopic change at GE junction.  Normal esophageal mucosa.  STOMACH: A small hiatal hernia was noted.   There was mild antral gastropathy noted.  Cold forcep biopsies were taken at the antrum and angularis.  DUODENUM: Moderate duodenal inflammation was found in the duodenal bulb.   The duodenal mucosa showed no abnormalities in the 2nd part of the duodenum.  Retroflexed views revealed a hiatal hernia.     The scope was then withdrawn from the patient and the procedure completed.  COMPLICATIONS: There were no immediate complications.  ENDOSCOPIC IMPRESSION: 1.   Schatzki ring at GE junction; dilation to 15 mm with TTS balloon 2.   Small hiatal hernia 3.   There was mild antral gastropathy noted, multiple biopsies 4.   Moderate duodenitis 5.   The duodenal mucosa showed no abnormalities in  the 2nd part of the duodenum  RECOMMENDATIONS: 1.  Await pathology results 2.  Begin pantoprazole 40 mg daily x 8 weeks given gastroduodenitis 3.  Repeat dilation as needed eSigned:  Jerene Bears, MD 10/24/2014 3:30 PM      NB:VAPOLI Laurance Flatten, MD and The Patient

## 2014-10-24 NOTE — Op Note (Signed)
[place]  [address] [place], [id number]   COLONOSCOPY PROCEDURE REPORT  PATIENT: Spencer White, Spencer White  MR#: [id number] BIRTHDATE: May 23, 1951 , 66  yrs. old GENDER: male ENDOSCOPIST: Jerene Bears, MD REFERRED VZ:DGLOVF Laurance Flatten, M.D. PROCEDURE DATE:  10/24/2014 PROCEDURE:   Colonoscopy, screening, Colonoscopy with snare polypectomy, and Colonoscopy with cold biopsy polypectomy First Screening Colonoscopy - Avg.  risk and is 50 yrs.  old or older - No.  Prior Negative Screening - Now for repeat screening. 10 or more years since last screening  History of Adenoma - Now for follow-up colonoscopy & has been > or = to 3 yrs.  N/A ASA CLASS:   Class II INDICATIONS:Screening for colonic neoplasia and Colorectal Neoplasm Risk Assessment for this procedure is average risk. MEDICATIONS: Monitored anesthesia care, this was the total dose used for all procedures at this session, and Propofol 500 mg IV  DESCRIPTION OF PROCEDURE:   After the risks benefits and alternatives of the procedure were thoroughly explained, informed consent was obtained.  The digital rectal exam revealed no rectal mass.   The LB [id number]  endoscope was introduced through the anus and advanced to the cecum, which was identified by both the appendix and ileocecal valve. No adverse events experienced. The quality of the prep was good.  (MoviPrep was used)  The instrument was then slowly withdrawn as the colon was fully examined.   COLON FINDINGS: Four sessile polyps ranging from 2 to 72mm in size were found at the cecum (1), in the ascending colon (1), and transverse colon (2).  Polypectomies were performed with cold forceps (1) and with a cold snare (3).  The resection was complete, the polyp tissue was completely retrieved and sent to histology. Four sessile polyps ranging from 5 to 4mm in size were found in the descending colon (1) and sigmoid colon (3).  Polypectomies were performed with  cold forceps (1) and with a cold snare (3).  The resection was complete, the polyp tissue was completely retrieved and sent to histology.   There was mild diverticulosis noted in the sigmoid colon.  Retroflexed views revealed internal hemorrhoids. The time to cecum = 3.1 Withdrawal time = 23.0   The scope was withdrawn and the procedure completed.  COMPLICATIONS: There were no immediate complications.   ENDOSCOPIC IMPRESSION: 1.   Four sessile polyps ranging from 2 to 66mm in size were found at the cecum, in the ascending colon, and transverse colon; polypectomies were performed with cold forceps and with a cold snare 2.   Four sessile polyps ranging from 5 to 9mm in size were found in the descending colon and sigmoid colon; polypectomies were performed with cold forceps and with a cold snare 3.   Mild diverticulosis was noted in the sigmoid colon  RECOMMENDATIONS: 1.  Await pathology results 2.  Avoid all NSAIDs for the next 2 weeks. 3.  High fiber diet 4.  Timing of repeat colonoscopy will be determined by pathology findings. 5.  You will receive a letter within 1-2 weeks with the results of your biopsy as well as final recommendations.  Please call my office if you have not received a letter after 3 weeks.  eSigned:  Jerene Bears, MD 10/24/2014 3:34 PM   cc: Redge Gainer, MD and The Patient   PATIENT NAME:  Spencer White, Spencer White MR#: [id number]

## 2014-10-25 ENCOUNTER — Telehealth: Payer: Self-pay | Admitting: *Deleted

## 2014-10-25 NOTE — Telephone Encounter (Signed)
Left message that we called for f/u 

## 2014-10-31 ENCOUNTER — Encounter: Payer: Self-pay | Admitting: Internal Medicine

## 2014-12-14 ENCOUNTER — Emergency Department (HOSPITAL_COMMUNITY): Payer: BLUE CROSS/BLUE SHIELD

## 2014-12-14 ENCOUNTER — Emergency Department (HOSPITAL_COMMUNITY)
Admission: EM | Admit: 2014-12-14 | Discharge: 2014-12-14 | Disposition: A | Discharge status: Home / Self Care | Payer: BLUE CROSS/BLUE SHIELD | Attending: Emergency Medicine | Admitting: Emergency Medicine

## 2014-12-14 ENCOUNTER — Encounter (HOSPITAL_COMMUNITY): Payer: Self-pay | Admitting: Emergency Medicine

## 2014-12-14 DIAGNOSIS — Z91040 Latex allergy status: Secondary | ICD-10-CM | POA: Diagnosis not present

## 2014-12-14 DIAGNOSIS — Z8546 Personal history of malignant neoplasm of prostate: Secondary | ICD-10-CM | POA: Diagnosis not present

## 2014-12-14 DIAGNOSIS — S93401A Sprain of unspecified ligament of right ankle, initial encounter: Secondary | ICD-10-CM | POA: Diagnosis not present

## 2014-12-14 DIAGNOSIS — Z79899 Other long term (current) drug therapy: Secondary | ICD-10-CM | POA: Insufficient documentation

## 2014-12-14 DIAGNOSIS — Y998 Other external cause status: Secondary | ICD-10-CM | POA: Insufficient documentation

## 2014-12-14 DIAGNOSIS — Z85828 Personal history of other malignant neoplasm of skin: Secondary | ICD-10-CM | POA: Insufficient documentation

## 2014-12-14 DIAGNOSIS — Y9389 Activity, other specified: Secondary | ICD-10-CM | POA: Insufficient documentation

## 2014-12-14 DIAGNOSIS — Z9889 Other specified postprocedural states: Secondary | ICD-10-CM | POA: Diagnosis not present

## 2014-12-14 DIAGNOSIS — Y9289 Other specified places as the place of occurrence of the external cause: Secondary | ICD-10-CM | POA: Insufficient documentation

## 2014-12-14 DIAGNOSIS — S62637A Displaced fracture of distal phalanx of left little finger, initial encounter for closed fracture: Secondary | ICD-10-CM | POA: Diagnosis not present

## 2014-12-14 DIAGNOSIS — S62639B Displaced fracture of distal phalanx of unspecified finger, initial encounter for open fracture: Secondary | ICD-10-CM

## 2014-12-14 DIAGNOSIS — F329 Major depressive disorder, single episode, unspecified: Secondary | ICD-10-CM | POA: Diagnosis not present

## 2014-12-14 DIAGNOSIS — I10 Essential (primary) hypertension: Secondary | ICD-10-CM | POA: Diagnosis not present

## 2014-12-14 DIAGNOSIS — Z23 Encounter for immunization: Secondary | ICD-10-CM | POA: Diagnosis not present

## 2014-12-14 DIAGNOSIS — J45909 Unspecified asthma, uncomplicated: Secondary | ICD-10-CM | POA: Diagnosis not present

## 2014-12-14 DIAGNOSIS — S61307A Unspecified open wound of left little finger with damage to nail, initial encounter: Secondary | ICD-10-CM | POA: Insufficient documentation

## 2014-12-14 DIAGNOSIS — S6992XA Unspecified injury of left wrist, hand and finger(s), initial encounter: Secondary | ICD-10-CM | POA: Diagnosis present

## 2014-12-14 DIAGNOSIS — W540XXA Bitten by dog, initial encounter: Secondary | ICD-10-CM | POA: Diagnosis not present

## 2014-12-14 DIAGNOSIS — Z8639 Personal history of other endocrine, nutritional and metabolic disease: Secondary | ICD-10-CM | POA: Insufficient documentation

## 2014-12-14 LAB — CBC WITH DIFFERENTIAL/PLATELET
BASOS PCT: 0 % (ref 0–1)
Basophils Absolute: 0 10*3/uL (ref 0.0–0.1)
EOS PCT: 1 % (ref 0–5)
Eosinophils Absolute: 0.1 10*3/uL (ref 0.0–0.7)
HCT: 41.6 % (ref 39.0–52.0)
HEMOGLOBIN: 14 g/dL (ref 13.0–17.0)
Lymphocytes Relative: 12 % (ref 12–46)
Lymphs Abs: 1.2 10*3/uL (ref 0.7–4.0)
MCH: 33.1 pg (ref 26.0–34.0)
MCHC: 33.7 g/dL (ref 30.0–36.0)
MCV: 98.3 fL (ref 78.0–100.0)
Monocytes Absolute: 0.7 10*3/uL (ref 0.1–1.0)
Monocytes Relative: 7 % (ref 3–12)
NEUTROS ABS: 7.8 10*3/uL — AB (ref 1.7–7.7)
NEUTROS PCT: 80 % — AB (ref 43–77)
PLATELETS: 225 10*3/uL (ref 150–400)
RBC: 4.23 MIL/uL (ref 4.22–5.81)
RDW: 12.8 % (ref 11.5–15.5)
WBC: 9.8 10*3/uL (ref 4.0–10.5)

## 2014-12-14 LAB — BASIC METABOLIC PANEL
ANION GAP: 7 (ref 5–15)
BUN: 18 mg/dL (ref 6–20)
CO2: 27 mmol/L (ref 22–32)
CREATININE: 1.03 mg/dL (ref 0.61–1.24)
Calcium: 8.8 mg/dL — ABNORMAL LOW (ref 8.9–10.3)
Chloride: 105 mmol/L (ref 101–111)
GFR calc non Af Amer: >60 mL/min (ref >60)
Glucose, Bld: 104 mg/dL — ABNORMAL HIGH (ref 65–99)
POTASSIUM: 4 mmol/L (ref 3.5–5.1)
Sodium: 139 mmol/L (ref 135–145)

## 2014-12-14 MED ORDER — OXYCODONE-ACETAMINOPHEN 5-325 MG PO TABS: 1.0000 | ORAL_TABLET | ORAL | Status: DC | PRN

## 2014-12-14 MED ORDER — TETANUS-DIPHTH-ACELL PERTUSSIS 5-2.5-18.5 LF-MCG/0.5 IM SUSP
0.5000 mL | Freq: Once | INTRAMUSCULAR | Status: AC
  Administered 2014-12-14: 0.5 mL via INTRAMUSCULAR
  Filled 2014-12-14: qty 0.5

## 2014-12-14 MED ORDER — HYDROMORPHONE HCL 1 MG/ML IJ SOLN
0.5000 mg | Freq: Once | INTRAMUSCULAR | Status: AC
  Administered 2014-12-14: 0.5 mg via INTRAVENOUS
  Filled 2014-12-14: qty 1

## 2014-12-14 MED ORDER — CEFAZOLIN SODIUM 1-5 GM-% IV SOLN
1.0000 g | Freq: Once | INTRAVENOUS | Status: AC
  Administered 2014-12-14: 1 g via INTRAVENOUS
  Filled 2014-12-14: qty 50

## 2014-12-14 MED ORDER — AMOXICILLIN-POT CLAVULANATE 875-125 MG PO TABS: 1.0000 | ORAL_TABLET | Freq: Two times a day (BID) | ORAL | Status: DC

## 2014-12-14 MED ORDER — SODIUM CHLORIDE 0.9 % IV SOLN
3.0000 g | Freq: Once | INTRAVENOUS | Status: AC
  Administered 2014-12-14: 3 g via INTRAVENOUS
  Filled 2014-12-14: qty 3

## 2014-12-14 MED ORDER — ONDANSETRON HCL 4 MG/2ML IJ SOLN
4.0000 mg | Freq: Once | INTRAMUSCULAR | Status: AC
  Administered 2014-12-14: 4 mg via INTRAVENOUS
  Filled 2014-12-14: qty 2

## 2014-12-14 NOTE — ED Notes (Signed)
Animal control verified that both dogs were UTD on vaccines.

## 2014-12-14 NOTE — ED Notes (Signed)
Animal control called to verify dog's rabies vaccine status.

## 2014-12-14 NOTE — ED Notes (Signed)
Pt provided with icepack.  Left hand soaking in sterile water and betadine.

## 2014-12-14 NOTE — ED Notes (Signed)
Pt states that his dog and the neighbors got into a fight and he tried to break it up and got bitten on the left pinkie.  Also c/o right ankle pain from twisting it.

## 2014-12-14 NOTE — ED Notes (Addendum)
PA looking for pts cell number because pt has not arrived to Orthopedics.

## 2014-12-14 NOTE — Discharge Instructions (Signed)
Animal Bite Animal bite wounds can get infected. It is important to get proper medical treatment. Ask your doctor if you need a rabies shot. HOME CARE   Follow your doctor's instructions for taking care of your wound.  Only take medicine as told by your doctor.  Take your medicine (antibiotics) as told. Finish them even if you start to feel better.  Keep all doctor visits as told. You may need a tetanus shot if:   You cannot remember when you had your last tetanus shot.  You have never had a tetanus shot.  The injury broke your skin. If you need a tetanus shot and you choose not to have one, you may get tetanus. Sickness from tetanus can be serious. GET HELP RIGHT AWAY IF:   Your wound is warm, red, sore, or puffy (swollen).  You notice yellowish-white fluid (pus) or a bad smell coming from the wound.  You see a red line on the skin coming from the wound.  You have a fever, chills, or you feel sick.  You feel sick to your stomach (nauseous), or you throw up (vomit).  Your pain does not go away, or it gets worse.  You have trouble moving the injured part.  You have questions or concerns. MAKE SURE YOU:   Understand these instructions.  Will watch your condition.  Will get help right away if you are not doing well or get worse. Document Released: 06/15/2005 Document Revised: 09/07/2011 Document Reviewed: 02/04/2011 [facility] Patient Information 2015 [place], [place]. This information is not intended to replace advice given to you by your health care provider. Make sure you discuss any questions you have with your health care provider.  Ankle Sprain An ankle sprain is an injury to the strong, fibrous tissues (ligaments) that hold your ankle bones together.  HOME CARE   Put ice on your ankle for 1-2 days or as told by your doctor.  Put ice in a plastic bag.  Place a towel between your skin and the bag.  Leave the ice on for 15-20 minutes at a time, every 2 hours  while you are awake.  Only take medicine as told by your doctor.  Raise (elevate) your injured ankle above the level of your heart as much as possible for 2-3 days.  Use crutches if your doctor tells you to. Slowly put your own weight on the affected ankle. Use the crutches until you can walk without pain.  If you have a plaster splint:  Do not rest it on anything harder than a pillow for 24 hours.  Do not put weight on it.  Do not get it wet.  Take it off to shower or bathe.  If given, use an elastic wrap or support stocking for support. Take the wrap off if your toes lose feeling (numb), tingle, or turn cold or blue.  If you have an air splint:  Add or let out air to make it comfortable.  Take it off at night and to shower and bathe.  Wiggle your toes and move your ankle up and down often while you are wearing it. GET HELP IF:  You have rapidly increasing bruising or puffiness (swelling).  Your toes feel very cold.  You lose feeling in your foot.  Your medicine does not help your pain. GET HELP RIGHT AWAY IF:   Your toes lose feeling (numb) or turn blue.  You have severe pain that is increasing. MAKE SURE YOU:   Understand these instructions.  Will watch your condition.  Will get help right away if you are not doing well or get worse. Document Released: 12/02/2007 Document Revised: 10/30/2013 Document Reviewed: 12/28/2011 [facility] Patient Information 2015 [place], [place]. This information is not intended to replace advice given to you by your health care provider. Make sure you discuss any questions you have with your health care provider.

## 2014-12-14 NOTE — ED Provider Notes (Addendum)
Medical screening examination/treatment/procedure(s) were conducted as a shared visit with non-physician practitioner(s) and myself.  I personally evaluated the patient during the encounter.   EKG Interpretation None      Patient seen by me. Patient status post injury to left little finger and right ankle due to to altercations that occurred between 2 dogs.  Patient is missing the distal aspect of the little finger on the left hand. Patient tetanus is not up-to-date will be updated today. Will be given IV antibodies. We'll get x-rays of the hand and the right ankle. Right ankle clinically seems to be a sprain.  Will of consult hand surgery regarding the distal amputation to the left little finger. No other significant injuries. One dog belongs to the patient both dogs immunizations are up-to-date. Both will need to be observed for any signs of illness over the next 2 weeks.  Fredia Sorrow, MD 12/14/14 [phone]   Results for orders placed or performed during the hospital encounter of 12/14/14  CBC with Differential  Result Value Ref Range   WBC 9.8 4.0 - 10.5 K/uL   RBC 4.23 4.22 - 5.81 MIL/uL   Hemoglobin 14.0 13.0 - 17.0 g/dL   HCT 41.6 39.0 - 52.0 %   MCV 98.3 78.0 - 100.0 fL   MCH 33.1 26.0 - 34.0 pg   MCHC 33.7 30.0 - 36.0 g/dL   RDW 12.8 11.5 - 15.5 %   Platelets 225 150 - 400 K/uL   Neutrophils Relative % 80 (H) 43 - 77 %   Neutro Abs 7.8 (H) 1.7 - 7.7 K/uL   Lymphocytes Relative 12 12 - 46 %   Lymphs Abs 1.2 0.7 - 4.0 K/uL   Monocytes Relative 7 3 - 12 %   Monocytes Absolute 0.7 0.1 - 1.0 K/uL   Eosinophils Relative 1 0 - 5 %   Eosinophils Absolute 0.1 0.0 - 0.7 K/uL   Basophils Relative 0 0 - 1 %   Basophils Absolute 0.0 0.0 - 0.1 K/uL  Basic metabolic panel  Result Value Ref Range   Sodium 139 135 - 145 mmol/L   Potassium 4.0 3.5 - 5.1 mmol/L   Chloride 105 101 - 111 mmol/L   CO2 27 22 - 32 mmol/L   Glucose, Bld 104 (H) 65 - 99 mg/dL   BUN 18 6 - 20 mg/dL   Creatinine, Ser 1.03 0.61 - 1.24 mg/dL   Calcium 8.8 (L) 8.9 - 10.3 mg/dL   GFR calc non Af Amer >60 >60 mL/min   GFR calc Af Amer >60 >60 mL/min   Anion gap 7 5 - 15   Dg Ankle Complete Right  12/14/2014   CLINICAL DATA:  Right ankle pain and swelling.  EXAM: RIGHT ANKLE - COMPLETE 3+ VIEW  COMPARISON:  None.  FINDINGS: There is no evidence of fracture, dislocation, or joint effusion. There is no evidence of arthropathy or other focal bone abnormality. There is soft tissue swelling over the lateral malleolus.  IMPRESSION: No acute osseous injury of the right ankle.   Electronically Signed   By: Kathreen Devoid   On: 12/14/2014 09:50   Dg Finger Little Left  12/14/2014   CLINICAL DATA:  Hit by dog wall breaking up dog fight with distal amputation of the fifth digit, initial encounter  EXAM: LEFT LITTLE FINGER 2+V  COMPARISON:  None.  FINDINGS: There is a transverse amputation through the fifth distal phalanx identified. No other acute bony abnormality is seen. Associated soft tissue defect is  noted as well.  IMPRESSION: Distal phalangeal amputation secondary to dog bite.   Electronically Signed   By: Inez Catalina M.D.   On: 12/14/2014 09:42    X-rays reveal no evidence of any bony injury to the ankle. Confirms the distal pharyngeal amputation of the little finger. Patient given also a dose of Unasyn IV. Contacted hand surgery who wanted to see him in the office today. Patient discharged from here to go to Dr. Shelbie Ammons office.  Fredia Sorrow, MD 12/14/14 [phone]

## 2014-12-14 NOTE — ED Provider Notes (Signed)
CSN: [id number]     Arrival date & time 12/14/14  5784 History   First MD Initiated Contact with Patient 12/14/14 [phone]     Chief Complaint  Patient presents with  . Animal Bite     (Consider location/radiation/quality/duration/timing/severity/associated sxs/prior Treatment) HPI   Spencer White is a 64 y.o. male who presents to the Emergency Department complaining of dog bite to his left fifth finger.  He states that he was trying to break up a fight between his dog and the neighbor's dog and was bitten on the left fifth finger.  The distal end of the left fifth finger is completely severed.  He states is is not sure which animal caused the injury, but believes it was his dog.  Animal had it's rabies vaccination two weeks ago and information has been verified by [place] control that both dog's are up to date.  Patient also complains of injury to the right ankle, stating that he twisted his ankle somehow during the incident.  He describes a throbbing, burning pain to the distal end of his finger.  He denies numbness, weakness, or other injuries.  Last Td is unknown.     Past Medical History  Diagnosis Date  . Prostate cancer 2011  . Hyperlipidemia   . Hypertension   . BCC (basal cell carcinoma), scalp/neck   . Depression   . Asthma   . Status post dilation of esophageal narrowing     ?maybe   Past Surgical History  Procedure Laterality Date  . Tonsillectomy    . Prostatectomy    . Prostatectomy  07/2009  . Colonoscopy     Family History  Problem Relation Age of Onset  . Lung cancer Mother   . Lung cancer Brother   . Colon cancer Neg Hx   . Colon polyps Neg Hx   . Kidney disease Neg Hx   . Gallbladder disease Neg Hx   . Esophageal cancer Neg Hx   . Diabetes Neg Hx   . Stomach cancer Neg Hx   . Rectal cancer Neg Hx   . Heart disease Father     Angioplasty stent  . Hypertension Father   . Hypertension Brother    History  Substance Use Topics  .  Smoking status: Never Smoker   . Smokeless tobacco: Never Used  . Alcohol Use: 0.0 oz/week    0 Standard drinks or equivalent per week     Comment: 1 glass red wine 1-2 a week    Review of Systems  Constitutional: Negative for fever and chills.  Cardiovascular: Negative for chest pain.  Gastrointestinal: Negative for nausea and vomiting.  Musculoskeletal: Positive for arthralgias (left fifth finger pain). Negative for joint swelling.  Skin: Positive for wound. Negative for color change.  Neurological: Negative for dizziness, weakness and numbness.  All other systems reviewed and are negative.     Allergies  Latex  Home Medications   Prior to Admission medications   Medication Sig Start Date End Date Taking? Authorizing Provider  pantoprazole (PROTONIX) 40 MG tablet Take 1 tablet (40 mg total) by mouth daily. 10/24/14   Jerene Bears, MD  venlafaxine XR (EFFEXOR-XR) 75 MG 24 hr capsule Take 75 mg by mouth daily with breakfast.    Historical Provider, MD   BP 139/86 mmHg  Pulse 58  Temp(Src) 98 F (36.7 C) (Oral)  Resp 16  Ht 6\' 1"  (1.854 m)  Wt 215 lb (97.523 kg)  BMI 28.37 kg/m2  SpO2 100% Physical Exam  Constitutional: He is oriented to person, place, and time. He appears well-developed and well-nourished. No distress.  HENT:  Head: Normocephalic and atraumatic.  Cardiovascular: Normal rate, regular rhythm, normal heart sounds and intact distal pulses.   No murmur heard. Pulmonary/Chest: Effort normal and breath sounds normal. No respiratory distress.  Musculoskeletal: Normal range of motion. He exhibits no edema or tenderness.       Left hand: He exhibits bony tenderness. He exhibits no swelling. Normal sensation noted. Normal strength noted.       Hands: Complete avulsion at the DIP of the left fifth finger.  Bleeding controlled.  Pt has full ROM at the PIP.  Small lac also present at the palmar aspect of the mid fifth finger.    Neurological: He is alert and  oriented to person, place, and time. He exhibits normal muscle tone. Coordination normal.  Skin: Skin is warm.  Nursing note and vitals reviewed.   Patient has given permission to use photos in his medical chart.  No photos or other information was stored on any cellular devices.              ED Course  Procedures (including critical care time) Labs Review Labs Reviewed  CBC WITH DIFFERENTIAL/PLATELET - Abnormal; Notable for the following:    Neutrophils Relative % 80 (*)    Neutro Abs 7.8 (*)    All other components within normal limits  BASIC METABOLIC PANEL - Abnormal; Notable for the following:    Glucose, Bld 104 (*)    Calcium 8.8 (*)    All other components within normal limits    Imaging Review Dg Ankle Complete Right  12/14/2014   CLINICAL DATA:  Right ankle pain and swelling.  EXAM: RIGHT ANKLE - COMPLETE 3+ VIEW  COMPARISON:  None.  FINDINGS: There is no evidence of fracture, dislocation, or joint effusion. There is no evidence of arthropathy or other focal bone abnormality. There is soft tissue swelling over the lateral malleolus.  IMPRESSION: No acute osseous injury of the right ankle.   Electronically Signed   By: Kathreen Devoid   On: 12/14/2014 09:50   Dg Finger Little Left  12/14/2014   CLINICAL DATA:  Hit by dog wall breaking up dog fight with distal amputation of the fifth digit, initial encounter  EXAM: LEFT LITTLE FINGER 2+V  COMPARISON:  None.  FINDINGS: There is a transverse amputation through the fifth distal phalanx identified. No other acute bony abnormality is seen. Associated soft tissue defect is noted as well.  IMPRESSION: Distal phalangeal amputation secondary to dog bite.   Electronically Signed   By: Inez Catalina M.D.   On: 12/14/2014 09:42     EKG Interpretation None      MDM   Final diagnoses:  Avulsion fracture of distal phalanx of finger, open, initial encounter  Dog bite  Ankle sprain, right, initial encounter   Wound soaked in  betadine/saline solution.  NV intact.  Bleeding controlled.    [name].  Recommends 3 g Unasyn and he will see pt in his office today after ED discharge. Pt agrees to plan and verbalized understanding   ASO splint applied to ankle.    Pt also seen by Dr. Donald Pore, PA-C 12/14/14 1432

## 2014-12-14 NOTE — ED Notes (Signed)
[place] left in place for Dr. Sherren [facility] upon request.  #20g jelco to right [facility].

## 2015-01-12 IMAGING — CR DG CHEST 2V
2 series · 2 of 2 positions shown · non-contrast
Comparison: None.

CLINICAL DATA: Annual physical exam

EXAM:
CHEST  2 VIEW

[view not recorded (1 of 2)]
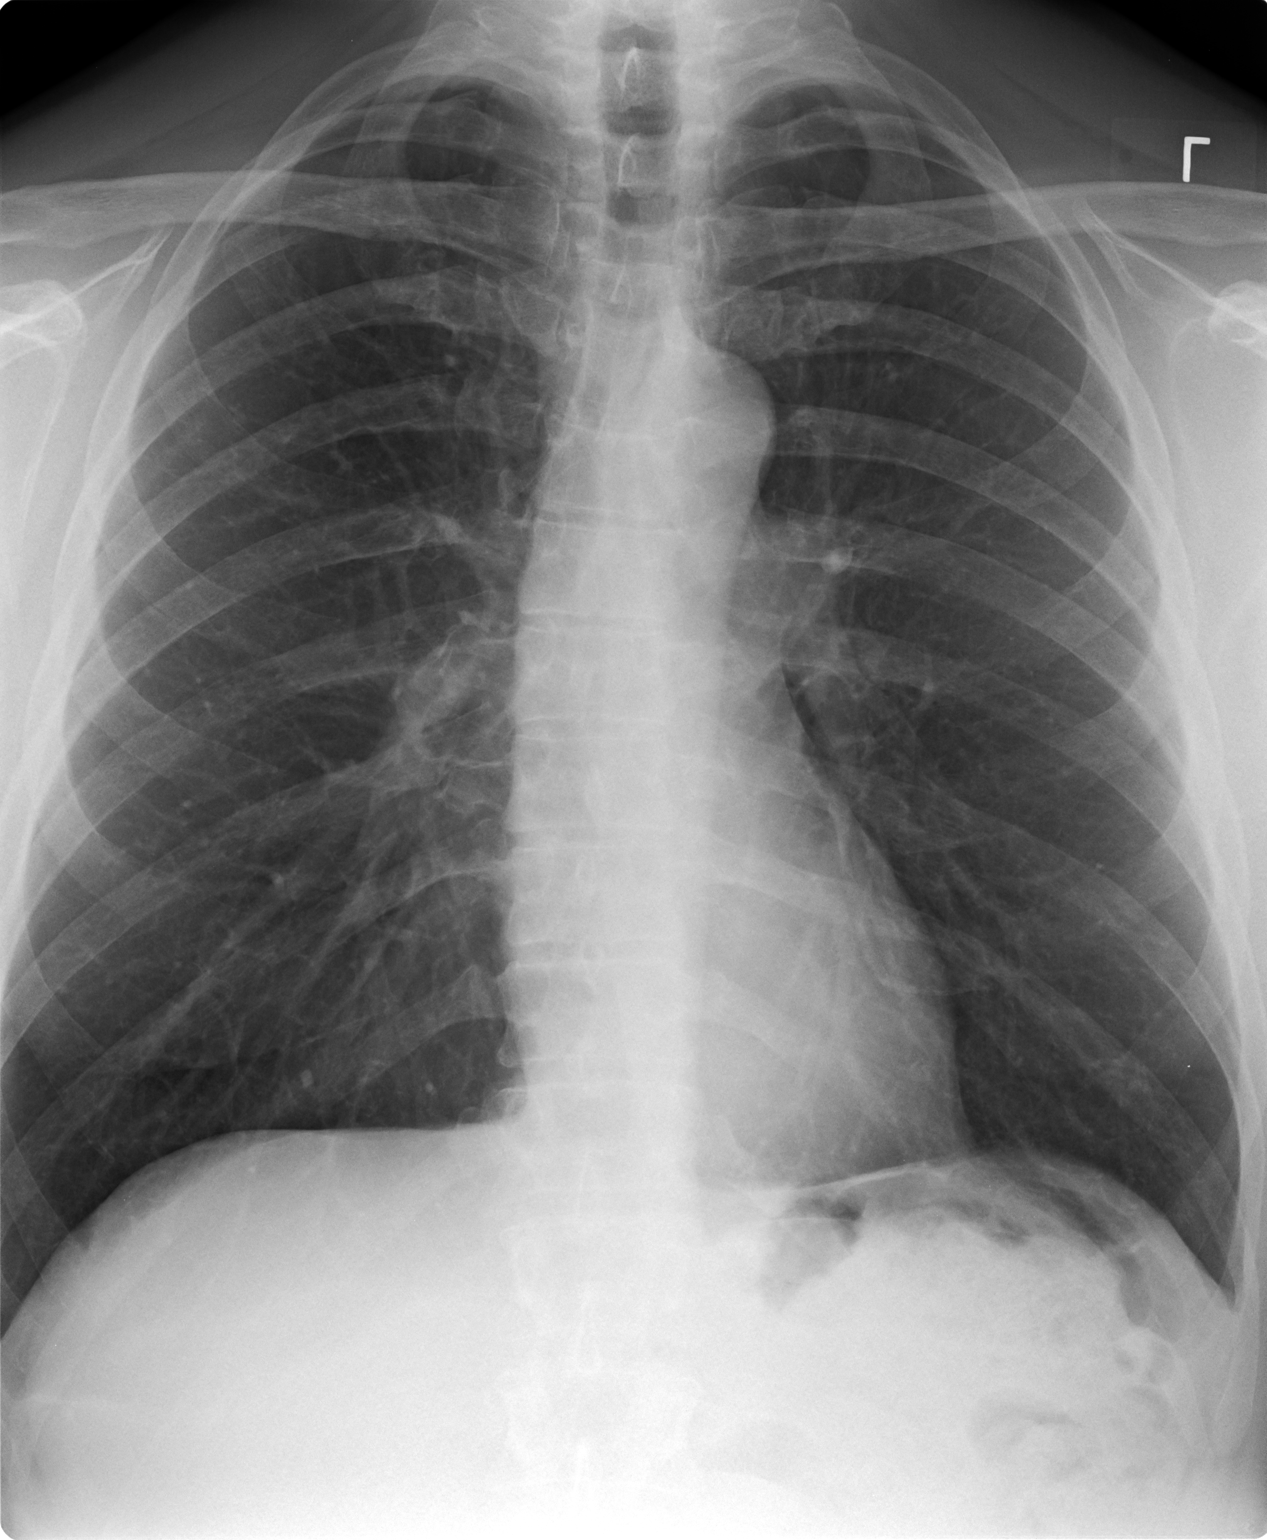

[view not recorded (2 of 2)]
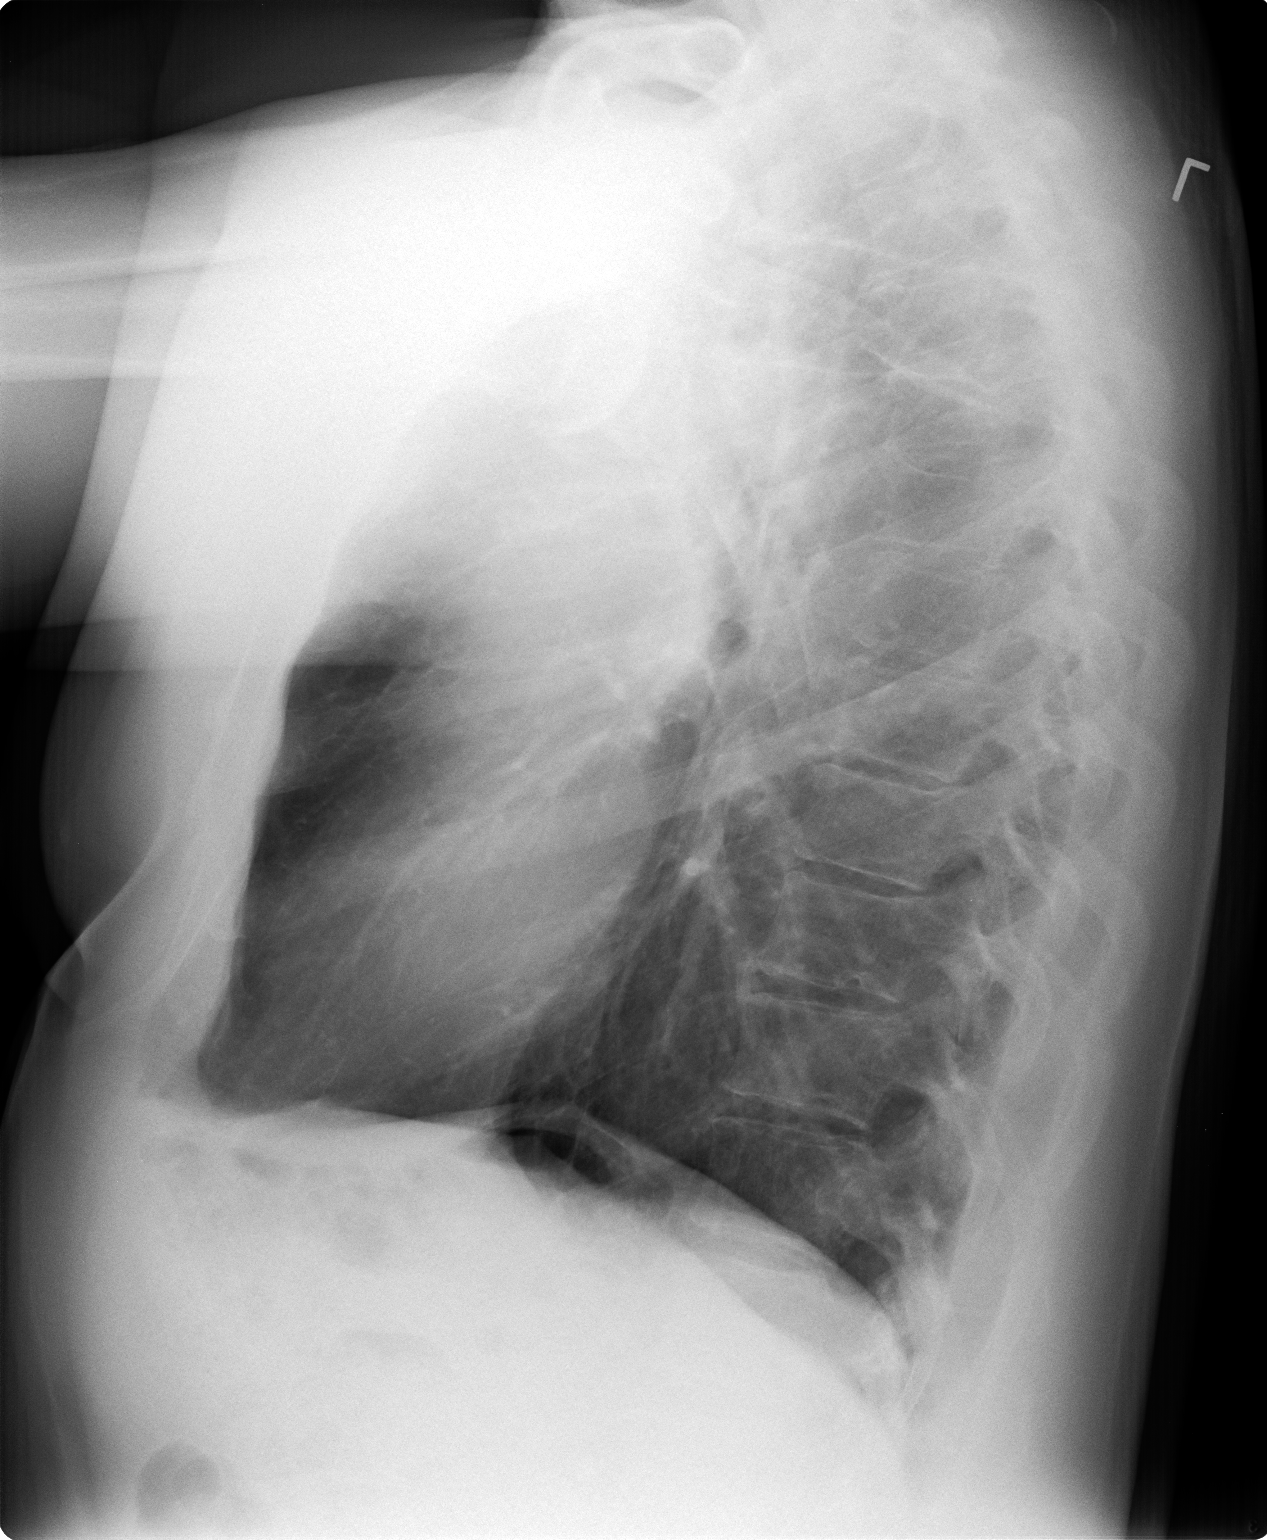

[2 of 2 positions shown; findings below may reference images not displayed]

FINDINGS: The lungs are clear and negative for focal airspace consolidation,
pulmonary edema or suspicious pulmonary nodule. No pleural effusion
or pneumothorax. Cardiac and mediastinal contours are within normal
limits. No acute fracture or lytic or blastic osseous lesions. The
visualized upper abdominal bowel gas pattern is unremarkable.
IMPRESSION: No active cardiopulmonary disease.

## 2015-02-12 ENCOUNTER — Telehealth: Payer: Self-pay | Admitting: Family Medicine

## 2015-02-12 NOTE — Telephone Encounter (Signed)
May need labs - but no other tests are due at this time Pt aware

## 2015-05-14 ENCOUNTER — Telehealth: Payer: Self-pay | Admitting: Family Medicine

## 2015-05-14 MED ORDER — VENLAFAXINE HCL ER 75 MG PO CP24: 75.0000 mg | ORAL_CAPSULE | Freq: Every day | ORAL | Status: DC

## 2015-05-14 NOTE — Addendum Note (Signed)
Addended by: Wardell Heath on: 05/14/2015 12:04 PM   Modules accepted: Orders

## 2015-05-14 NOTE — Telephone Encounter (Signed)
This is okay to refill this medication 

## 2015-05-14 NOTE — Telephone Encounter (Signed)
Patient called stating that Dr. Caprice Beaver has been prescribing effexor and was told that he does not need to see this doctor anymore and that his PCP needs to prescribe this medication.

## 2015-05-17 ENCOUNTER — Ambulatory Visit (INDEPENDENT_AMBULATORY_CARE_PROVIDER_SITE_OTHER): Payer: BLUE CROSS/BLUE SHIELD

## 2015-05-17 ENCOUNTER — Ambulatory Visit: Payer: BLUE CROSS/BLUE SHIELD | Admitting: Family Medicine

## 2015-05-17 ENCOUNTER — Ambulatory Visit: Payer: BLUE CROSS/BLUE SHIELD

## 2015-05-17 DIAGNOSIS — Z23 Encounter for immunization: Secondary | ICD-10-CM | POA: Diagnosis not present

## 2015-05-17 NOTE — Progress Notes (Signed)
fluarix and prevnar 13 given pt tolerated well 

## 2015-06-13 ENCOUNTER — Ambulatory Visit: Payer: BLUE CROSS/BLUE SHIELD

## 2015-06-17 ENCOUNTER — Ambulatory Visit (INDEPENDENT_AMBULATORY_CARE_PROVIDER_SITE_OTHER): Payer: BLUE CROSS/BLUE SHIELD | Admitting: *Deleted

## 2015-06-17 DIAGNOSIS — Z23 Encounter for immunization: Secondary | ICD-10-CM

## 2015-06-17 NOTE — Progress Notes (Signed)
Pt tolerated inj well

## 2015-07-16 ENCOUNTER — Other Ambulatory Visit: Payer: Self-pay | Admitting: Family Medicine

## 2015-07-16 DIAGNOSIS — R911 Solitary pulmonary nodule: Secondary | ICD-10-CM

## 2015-07-23 ENCOUNTER — Encounter: Payer: Self-pay | Admitting: Family Medicine

## 2015-07-30 ENCOUNTER — Ambulatory Visit (INDEPENDENT_AMBULATORY_CARE_PROVIDER_SITE_OTHER): Payer: BLUE CROSS/BLUE SHIELD | Admitting: Family Medicine

## 2015-07-30 ENCOUNTER — Encounter: Payer: Self-pay | Admitting: Family Medicine

## 2015-07-30 VITALS — BP 117/72 | HR 68 | Temp 97.0°F | Ht 73.0 in | Wt 210.0 lb

## 2015-07-30 DIAGNOSIS — Z8546 Personal history of malignant neoplasm of prostate: Secondary | ICD-10-CM

## 2015-07-30 DIAGNOSIS — R030 Elevated blood-pressure reading, without diagnosis of hypertension: Secondary | ICD-10-CM

## 2015-07-30 DIAGNOSIS — Z0000 Encounter for general adult medical examination without abnormal findings: Secondary | ICD-10-CM | POA: Diagnosis not present

## 2015-07-30 DIAGNOSIS — K635 Polyp of colon: Secondary | ICD-10-CM

## 2015-07-30 DIAGNOSIS — E785 Hyperlipidemia, unspecified: Secondary | ICD-10-CM

## 2015-07-30 LAB — POCT URINALYSIS DIPSTICK
Bilirubin, UA: NEGATIVE
Blood, UA: NEGATIVE
Glucose, UA: NEGATIVE
KETONES UA: NEGATIVE
Leukocytes, UA: NEGATIVE
NITRITE UA: NEGATIVE
PH UA: 7
PROTEIN UA: NEGATIVE
Spec Grav, UA: <=1.005
Urobilinogen, UA: NEGATIVE

## 2015-07-30 LAB — POCT UA - MICROSCOPIC ONLY
Bacteria, U Microscopic: NEGATIVE
Casts, Ur, LPF, POC: NEGATIVE
Crystals, Ur, HPF, POC: NEGATIVE
Epithelial cells, urine per micros: NEGATIVE
Mucus, UA: NEGATIVE
RBC, urine, microscopic: NEGATIVE
WBC, UR, HPF, POC: NEGATIVE
YEAST UA: NEGATIVE

## 2015-07-30 MED ORDER — VENLAFAXINE HCL ER 75 MG PO CP24: 75.0000 mg | ORAL_CAPSULE | Freq: Every day | ORAL | Status: DC

## 2015-07-30 NOTE — Progress Notes (Signed)
Subjective:    Patient ID: Spencer White, male    DOB: 26-Aug-1950, 65 y.o.   MRN: [id number]  HPI Patient is here today for annual wellness exam and follow up of chronic medical problems which includes hyperlipidemea. He is taking medication regularly. The patient comes today with no complaints. He has had prostate cancer and a robotic prostatectomy. The patient does not see the urologist anymore but the urologist requests a PSA on a yearly basis. The patient is also concerned about ear cerumen and wants [place] to make sure we check that. He has had a colonoscopy and endoscopy and some precancerous polyps were found on this exam and the gastroenterologist plans to repeat these studies 3 years later. The patient has not seen any blood in the stool or having black tarry bowel movements. He denies chest pain shortness of breath problems with his stomach as far as heartburn indigestion nausea vomiting or diarrhea. He's passing his water without problems. He has occasional morning erections. He will be given an FOBT to return today. He is due to get an eye exam.      Patient Active Problem List   Diagnosis Date Noted  . History of prostate cancer 07/12/2013  . Depression 07/12/2013  . Hyperlipidemia 07/12/2013  . Blood pressure elevated without history of HTN 07/12/2013   Outpatient Encounter Prescriptions as of 07/30/2015  Medication Sig  . venlafaxine XR (EFFEXOR-XR) 75 MG 24 hr capsule Take 1 capsule (75 mg total) by mouth daily with breakfast.  . [DISCONTINUED] venlafaxine XR (EFFEXOR-XR) 75 MG 24 hr capsule Take 1 capsule (75 mg total) by mouth daily with breakfast.  . [DISCONTINUED] amoxicillin-clavulanate (AUGMENTIN) 875-125 MG per tablet Take 1 tablet by mouth 2 (two) times daily. For 10 days  . [DISCONTINUED] oxyCODONE-acetaminophen (PERCOCET/ROXICET) 5-325 MG per tablet Take 1 tablet by mouth every 4 (four) hours as needed.  . [DISCONTINUED] pantoprazole (PROTONIX) 40 MG tablet Take 1  tablet (40 mg total) by mouth daily. (Patient not taking: Reported on 12/14/2014)   No facility-administered encounter medications on file as of 07/30/2015.      Review of Systems  Constitutional: Negative.   HENT: Negative.   Eyes: Negative.   Respiratory: Negative.   Cardiovascular: Negative.   Gastrointestinal: Negative.   Endocrine: Negative.   Genitourinary: Negative.   Musculoskeletal: Negative.   Skin: Negative.   Allergic/Immunologic: Negative.   Neurological: Negative.   Hematological: Negative.   Psychiatric/Behavioral: Negative.        Objective:   Physical Exam  Constitutional: He is oriented to person, place, and time. He appears well-developed and well-nourished. No distress.  HENT:  Head: Normocephalic and atraumatic.  Left Ear: External ear normal.  Mouth/Throat: Oropharynx is clear and moist. No oropharyngeal exudate.  Nasal congestion Right ear cerumen  Eyes: Conjunctivae and EOM are normal. Pupils are equal, round, and reactive to light. Right eye exhibits no discharge. Left eye exhibits no discharge. No scleral icterus.  Neck: Normal range of motion. Neck supple. No thyromegaly present.  Cardiovascular: Normal rate, regular rhythm, normal heart sounds and intact distal pulses.   No murmur heard. Pulmonary/Chest: Effort normal and breath sounds normal. No respiratory distress. He has no wheezes. He has no rales. He exhibits no tenderness.  Clear anteriorly and posteriorly no axillary adenopathy  Abdominal: Soft. Bowel sounds are normal. He exhibits no mass. There is no tenderness. There is no rebound and no guarding.  Nontender without masses or White enlargement or bruits  Genitourinary: Rectum  normal and penis normal.  On rectal exam the prostate vault was empty without any lumps or masses. There are no rectal masses. The external genitalia were within normal limits and there is no inguinal adenopathy.  Musculoskeletal: Normal range of motion. He exhibits  no edema.  Lymphadenopathy:    He has no cervical adenopathy.  Neurological: He is alert and oriented to person, place, and time. He has normal reflexes. No cranial nerve deficit.  Skin: Skin is warm and dry. No rash noted. No erythema.  Psychiatric: He has a normal mood and affect. His behavior is normal. Judgment and thought content normal.  Nursing note and vitals reviewed.  BP 117/72 mmHg  Pulse 68  Temp(Src) 97 F (36.1 C) (Oral)  Ht '6\' 1"'  (1.854 m)  Wt 210 lb (95.255 kg)  BMI 27.71 kg/m2        Assessment & Plan:  1. Hyperlipidemia -Continue aggressive therapeutic lifestyle changes pending results of lab work - CBC with Differential/Platelet; Future - Hepatic function panel; Future - NMR, lipoprofile; Future  2. Blood pressure elevated without history of HTN -Blood pressure is good today and patient will continue with sodium restriction - BMP8+EGFR; Future - CBC with Differential/Platelet; Future  3. Annual physical exam -Ear irrigation -All up with gastroenterology for repeat colonoscopy as planned -See dermatology regularly -Do not forget to get eye exam regularly - BMP8+EGFR; Future - CBC with Differential/Platelet; Future - Hepatic function panel; Future - NMR, lipoprofile; Future - VITAMIN D 25 Hydroxy (Vit-D Deficiency, Fractures); Future - PSA, total and free; Future - POCT urinalysis dipstick - POCT UA - Microscopic Only  4. Prostate cancer -Yearly exam with PSAs sent to Dr. Jonette Eva - CBC with Differential/Platelet; Future - PSA, total and free; Future - POCT urinalysis dipstick - POCT UA - Microscopic Only  5. Colon polyps -Follow up with gastroenterology as planned  Meds ordered this encounter  Medications  . venlafaxine XR (EFFEXOR-XR) 75 MG 24 hr capsule    Sig: Take 1 capsule (75 mg total) by mouth daily with breakfast.    Dispense:  90 capsule    Refill:  3   Patient Instructions  Continue current medications. Continue good  therapeutic lifestyle changes which include good diet and exercise. Fall precautions discussed with patient. If an FOBT was given today- please return it to our front desk. If you are over 55 years old - you may need Prevnar 51 or the adult Pneumonia vaccine.  **Flu shots are available--- please call and schedule a FLU-CLINIC appointment**  After your visit with [place] today you will receive a survey in the mail or online from [employer] regarding your care with [place]. Please take a moment to fill this out. Your feedback is very important to [place] as you can help [place] better understand your patient needs as well as improve your experience and satisfaction. WE CARE ABOUT YOU!!!   The patient should follow-up with a gastroenterologist regularly for her colonoscopies and endoscopies He should return the FOBT This winter he should drink plenty of fluids stay well hydrated and if necessary use a cool mist humidifier to keep his respiratory tract good shape Continue to exercise and walk regularly He can use Debrox eardrops to help soften earwax and these can be used 2-3 drops to the affected ear for 2 or 3 nights in a row wait 1 week and repeat Continue to follow-up with dermatology   Arrie Senate MD

## 2015-07-30 NOTE — Patient Instructions (Addendum)
Continue current medications. Continue good therapeutic lifestyle changes which include good diet and exercise. Fall precautions discussed with patient. If an FOBT was given today- please return it to our front desk. If you are over 64 years old - you may need Prevnar 58 or the adult Pneumonia vaccine.  **Flu shots are available--- please call and schedule a FLU-CLINIC appointment**  After your visit with [place] today you will receive a survey in the mail or online from [employer] regarding your care with [place]. Please take a moment to fill this out. Your feedback is very important to [place] as you can help [place] better understand your patient needs as well as improve your experience and satisfaction. WE CARE ABOUT YOU!!!   The patient should follow-up with a gastroenterologist regularly for her colonoscopies and endoscopies He should return the FOBT This winter he should drink plenty of fluids stay well hydrated and if necessary use a cool mist humidifier to keep his respiratory tract good shape Continue to exercise and walk regularly He can use Debrox eardrops to help soften earwax and these can be used 2-3 drops to the affected ear for 2 or 3 nights in a row wait 1 week and repeat Continue to follow-up with dermatology

## 2015-10-30 ENCOUNTER — Telehealth: Payer: Self-pay

## 2015-11-04 ENCOUNTER — Encounter: Payer: Self-pay | Admitting: *Deleted

## 2016-01-01 ENCOUNTER — Telehealth: Payer: Self-pay | Admitting: Family Medicine

## 2016-01-01 DIAGNOSIS — IMO0001 Reserved for inherently not codable concepts without codable children: Secondary | ICD-10-CM

## 2016-01-01 DIAGNOSIS — E559 Vitamin D deficiency, unspecified: Secondary | ICD-10-CM

## 2016-01-01 DIAGNOSIS — E785 Hyperlipidemia, unspecified: Secondary | ICD-10-CM

## 2016-01-01 DIAGNOSIS — R030 Elevated blood-pressure reading, without diagnosis of hypertension: Secondary | ICD-10-CM

## 2016-01-01 NOTE — Telephone Encounter (Signed)
Would you like the same labs that were done in January?

## 2016-01-15 ENCOUNTER — Other Ambulatory Visit: Payer: BLUE CROSS/BLUE SHIELD

## 2016-01-15 ENCOUNTER — Other Ambulatory Visit: Payer: Self-pay | Admitting: *Deleted

## 2016-01-15 DIAGNOSIS — E559 Vitamin D deficiency, unspecified: Secondary | ICD-10-CM

## 2016-01-15 DIAGNOSIS — E785 Hyperlipidemia, unspecified: Secondary | ICD-10-CM

## 2016-01-15 DIAGNOSIS — Z8546 Personal history of malignant neoplasm of prostate: Secondary | ICD-10-CM

## 2016-01-15 DIAGNOSIS — IMO0001 Reserved for inherently not codable concepts without codable children: Secondary | ICD-10-CM

## 2016-01-15 DIAGNOSIS — R030 Elevated blood-pressure reading, without diagnosis of hypertension: Secondary | ICD-10-CM

## 2016-01-16 LAB — VITAMIN D 25 HYDROXY (VIT D DEFICIENCY, FRACTURES): VIT D 25 HYDROXY: 64.4 ng/mL (ref 30.0–100.0)

## 2016-01-16 LAB — BMP8+EGFR
BUN / CREAT RATIO: 9 — AB (ref 10–24)
BUN: 10 mg/dL (ref 8–27)
CO2: 22 mmol/L (ref 18–29)
CREATININE: 1.11 mg/dL (ref 0.76–1.27)
Calcium: 9.3 mg/dL (ref 8.6–10.2)
Chloride: 105 mmol/L (ref 96–106)
GFR calc Af Amer: 81 mL/min/{1.73_m2} (ref >59)
GFR calc non Af Amer: 70 mL/min/{1.73_m2} (ref >59)
GLUCOSE: 97 mg/dL (ref 65–99)
Potassium: 5.4 mmol/L — ABNORMAL HIGH (ref 3.5–5.2)
Sodium: 144 mmol/L (ref 134–144)

## 2016-01-16 LAB — CBC WITH DIFFERENTIAL/PLATELET
Basophils Absolute: 0 10*3/uL (ref 0.0–0.2)
Basos: 0 %
EOS (ABSOLUTE): 0.2 10*3/uL (ref 0.0–0.4)
Eos: 4 %
HEMOGLOBIN: 15.4 g/dL (ref 12.6–17.7)
Hematocrit: 45.4 % (ref 37.5–51.0)
Immature Grans (Abs): 0 10*3/uL (ref 0.0–0.1)
Immature Granulocytes: 0 %
LYMPHS: 35 %
Lymphocytes Absolute: 2 10*3/uL (ref 0.7–3.1)
MCH: 33.3 pg — ABNORMAL HIGH (ref 26.6–33.0)
MCHC: 33.9 g/dL (ref 31.5–35.7)
MCV: 98 fL — AB (ref 79–97)
MONOCYTES: 8 %
Monocytes Absolute: 0.5 10*3/uL (ref 0.1–0.9)
NEUTROS ABS: 3 10*3/uL (ref 1.4–7.0)
Neutrophils: 53 %
Platelets: 289 10*3/uL (ref 150–379)
RBC: 4.62 x10E6/uL (ref 4.14–5.80)
RDW: 13.6 % (ref 12.3–15.4)
WBC: 5.6 10*3/uL (ref 3.4–10.8)

## 2016-01-16 LAB — HEPATIC FUNCTION PANEL
ALK PHOS: 56 IU/L (ref 39–117)
ALT: 25 IU/L (ref 0–44)
AST: 32 IU/L (ref 0–40)
Albumin: 4.3 g/dL (ref 3.6–4.8)
BILIRUBIN TOTAL: 0.4 mg/dL (ref 0.0–1.2)
BILIRUBIN, DIRECT: 0.09 mg/dL (ref 0.00–0.40)
TOTAL PROTEIN: 6.5 g/dL (ref 6.0–8.5)

## 2016-01-16 LAB — NMR, LIPOPROFILE
Cholesterol: 230 mg/dL — ABNORMAL HIGH (ref 100–199)
HDL CHOLESTEROL BY NMR: 45 mg/dL (ref >39)
HDL Particle Number: 32.3 umol/L (ref >=30.5)
LDL Particle Number: 2459 nmol/L — ABNORMAL HIGH (ref <1000)
LDL Size: 20.3 nm (ref >20.5)
LDL-C: 167 mg/dL — ABNORMAL HIGH (ref 0–99)
LP-IR Score: 66 — ABNORMAL HIGH (ref <=45)
Small LDL Particle Number: 1441 nmol/L — ABNORMAL HIGH (ref <=527)
Triglycerides by NMR: 89 mg/dL (ref 0–149)

## 2016-01-16 LAB — PSA, TOTAL AND FREE

## 2016-01-17 ENCOUNTER — Other Ambulatory Visit: Payer: BLUE CROSS/BLUE SHIELD

## 2016-01-17 DIAGNOSIS — Z1211 Encounter for screening for malignant neoplasm of colon: Secondary | ICD-10-CM

## 2016-01-20 LAB — FECAL OCCULT BLOOD, IMMUNOCHEMICAL: Fecal Occult Bld: NEGATIVE

## 2016-01-21 ENCOUNTER — Encounter: Payer: Self-pay | Admitting: Family Medicine

## 2016-01-21 ENCOUNTER — Ambulatory Visit (INDEPENDENT_AMBULATORY_CARE_PROVIDER_SITE_OTHER): Payer: BLUE CROSS/BLUE SHIELD | Admitting: Family Medicine

## 2016-01-21 VITALS — BP 137/81 | HR 63 | Temp 97.1°F | Ht 73.0 in | Wt 215.0 lb

## 2016-01-21 DIAGNOSIS — R030 Elevated blood-pressure reading, without diagnosis of hypertension: Secondary | ICD-10-CM

## 2016-01-21 DIAGNOSIS — E559 Vitamin D deficiency, unspecified: Secondary | ICD-10-CM

## 2016-01-21 DIAGNOSIS — E875 Hyperkalemia: Secondary | ICD-10-CM | POA: Diagnosis not present

## 2016-01-21 DIAGNOSIS — I499 Cardiac arrhythmia, unspecified: Secondary | ICD-10-CM | POA: Diagnosis not present

## 2016-01-21 DIAGNOSIS — E785 Hyperlipidemia, unspecified: Secondary | ICD-10-CM | POA: Diagnosis not present

## 2016-01-21 DIAGNOSIS — IMO0001 Reserved for inherently not codable concepts without codable children: Secondary | ICD-10-CM

## 2016-01-21 DIAGNOSIS — Z8546 Personal history of malignant neoplasm of prostate: Secondary | ICD-10-CM | POA: Diagnosis not present

## 2016-01-21 NOTE — Progress Notes (Signed)
Subjective:    Patient ID: Spencer White, male    DOB: [birth year], 65 y.o.   MRN: [id number]  HPI Pt here for follow up and management of chronic medical problems which includes hyperlipidemia. He is taking medications regularly.The patient is doing well overall. He is had recent lab work done and everything on the lab work was good except his cholesterol numbers have worsened since the last visit. He has had prostate cancer and his PSA on the lab work that was done remains less than 0.1 and this is good. The lab work will be reviewed with him during the visit today. He has no specific complaints. The patient denies chest pain or shortness of breath. He is had a colonoscopy a couple of years ago and is due for another one in 1-2 years by the gastroenterologist because of multiple polyps. He did have some trouble swallowing at the time and had a dilatation. He still is having some trouble swallowing but as long as it doesn't get any worse he will wait until he has his next colonoscopy to take care of this. He denies any heartburn indigestion nausea vomiting diarrhea or blood in the stool. He is passing his water without problems. He has no musculoskeletal issues to complain of. He is due to get his eye exam. He promises to arrange an appointment for this.     Patient Active Problem List   Diagnosis Date Noted  . History of prostate cancer 07/12/2013  . Depression 07/12/2013  . Hyperlipidemia 07/12/2013  . Blood pressure elevated without history of HTN 07/12/2013   Outpatient Encounter Prescriptions as of 01/21/2016  Medication Sig  . venlafaxine XR (EFFEXOR-XR) 75 MG 24 hr capsule Take 1 capsule (75 mg total) by mouth daily with breakfast.   No facility-administered encounter medications on file as of 01/21/2016.       Review of Systems  Constitutional: Negative.   HENT: Negative.   Eyes: Negative.   Respiratory: Negative.   Cardiovascular: Negative.   Gastrointestinal: Negative.     Endocrine: Negative.   Genitourinary: Negative.   Musculoskeletal: Negative.   Skin: Negative.   Allergic/Immunologic: Negative.   Neurological: Negative.   Hematological: Negative.   Psychiatric/Behavioral: Negative.        Objective:   Physical Exam  Constitutional: He is oriented to person, place, and time. He appears well-developed and well-nourished. No distress.  HENT:  Head: Normocephalic and atraumatic.  Right Ear: External ear normal.  Left Ear: External ear normal.  Nose: Nose normal.  Mouth/Throat: Oropharynx is clear and moist. No oropharyngeal exudate.  Eyes: Conjunctivae and EOM are normal. Pupils are equal, round, and reactive to light. Right eye exhibits no discharge. Left eye exhibits no discharge. No scleral icterus.  Neck: Normal range of motion. Neck supple. No tracheal deviation present. No thyromegaly present.  No bruits or thyromegaly  Cardiovascular: Normal rate, normal heart sounds and intact distal pulses.   No murmur heard. The heart was slightly irregular at 72/m  Pulmonary/Chest: Effort normal and breath sounds normal. No respiratory distress. He has no wheezes. He has no rales. He exhibits no tenderness.  Clear anteriorly and posteriorly no axillary adenopathy  Abdominal: Soft. Bowel sounds are normal. He exhibits no mass. There is no tenderness. There is no rebound and no guarding.  No abdominal tenderness liver or spleen enlargement or inguinal adenopathy  Musculoskeletal: Normal range of motion. He exhibits no edema or deformity.  Lymphadenopathy:    He has no cervical  adenopathy.  Neurological: He is alert and oriented to person, place, and time. He has normal reflexes. No cranial nerve deficit.  The right knee jerk was slightly decreased compared to the left  Skin: Skin is warm and dry. No rash noted.  Psychiatric: He has a normal mood and affect. His behavior is normal. Judgment and thought content normal.  The patient was alert and pleasant.   Nursing note and vitals reviewed.  BP 137/81 (BP Location: Left Arm)   Pulse 63   Temp 97.1 F (36.2 C) (Oral)   Ht '6\' 1"'  (1.854 m)   Wt 215 lb (97.5 kg)   BMI 28.37 kg/m         Assessment & Plan:  1. Hyperlipidemia -The patient wants to continue with aggressive therapeutic lifestyle changes without treatment until 3 months from now when he will have a traditional lipid liver panel done. - Exercise Tolerance Test; Future - Lipid panel; Future - Hepatic function panel; Future  2. Vitamin D deficiency -He will continue with the current dose of vitamin D which is 4000 units daily  3. Elevated blood pressure -The blood pressure is good today. He will continue to watch sodium intake. - Exercise Tolerance Test; Future  4. Prostate cancer -The patient is about 6 years out from his prostatectomy and is pleased with the results and his PSA remains less than 0.1.  5. Serum potassium elevated - BMP8+EGFR  6. Irregular heartbeat -Reduce caffeine intake  Patient Instructions  Continue current medications. Continue good therapeutic lifestyle changes which include good diet and exercise. Fall precautions discussed with patient. If an FOBT was given today- please return it to our front desk. If you are over 44 years old - you may need Prevnar 42 or the adult Pneumonia vaccine.  after your visit with [place] today you will receive a survey in the mail or online from [employer] regarding your care with [place]. Please take a moment to fill this out. Your feedback is very important to [place] as you can help [place] better understand your patient needs as well as improve your experience and satisfaction. WE CARE ABOUT YOU!!!   Work aggressively on diet and exercise and repeat traditional lipid panel in about 3 months Reduce caffeine intake as much as possible Avoid NSAIDs as much as possible Don't forget to get your eye exam Arrie Senate MD

## 2016-01-21 NOTE — Patient Instructions (Addendum)
Continue current medications. Continue good therapeutic lifestyle changes which include good diet and exercise. Fall precautions discussed with patient. If an FOBT was given today- please return it to our front desk. If you are over 64 years old - you may need Prevnar 72 or the adult Pneumonia vaccine.  after your visit with [place] today you will receive a survey in the mail or online from [employer] regarding your care with [place]. Please take a moment to fill this out. Your feedback is very important to [place] as you can help [place] better understand your patient needs as well as improve your experience and satisfaction. WE CARE ABOUT YOU!!!   Work aggressively on diet and exercise and repeat traditional lipid panel in about 3 months Reduce caffeine intake as much as possible Avoid NSAIDs as much as possible Don't forget to get your eye exam

## 2016-01-22 LAB — BMP8+EGFR
BUN/Creatinine Ratio: 15 (ref 10–24)
BUN: 16 mg/dL (ref 8–27)
CO2: 22 mmol/L (ref 18–29)
Calcium: 9.7 mg/dL (ref 8.6–10.2)
Chloride: 99 mmol/L (ref 96–106)
Creatinine, Ser: 1.06 mg/dL (ref 0.76–1.27)
GFR calc Af Amer: 86 mL/min/{1.73_m2} (ref >59)
GFR calc non Af Amer: 74 mL/min/{1.73_m2} (ref >59)
GLUCOSE: 93 mg/dL (ref 65–99)
Potassium: 5.5 mmol/L — ABNORMAL HIGH (ref 3.5–5.2)
SODIUM: 139 mmol/L (ref 134–144)

## 2016-01-24 ENCOUNTER — Ambulatory Visit: Payer: BLUE CROSS/BLUE SHIELD | Admitting: Family Medicine

## 2016-01-24 ENCOUNTER — Telehealth: Payer: Self-pay | Admitting: Family Medicine

## 2016-01-24 NOTE — Telephone Encounter (Signed)
Patient aware of results and will come back for another repeat.

## 2016-01-24 NOTE — Addendum Note (Signed)
Addended by: Thana Ates on: 01/24/2016 11:46 AM   Modules accepted: Orders

## 2016-01-31 ENCOUNTER — Telehealth: Payer: Self-pay | Admitting: *Deleted

## 2016-01-31 NOTE — Telephone Encounter (Signed)
-----   Message from Worthy Rancher, MD sent at 01/30/2016  8:04 AM EDT ----- Yes he looks find the schedule.  ----- Message ----- From: Thana Ates, LPN Sent: [id number]  [id number] AM To: Georga Kaufmann, LPN, Theron Arista, RN, #  Please review for treadmill

## 2016-01-31 NOTE — Telephone Encounter (Signed)
Appointment scheduled for 11/9 @ 9:30am.

## 2016-05-12 ENCOUNTER — Other Ambulatory Visit: Payer: Self-pay | Admitting: Family Medicine

## 2016-06-15 IMAGING — DX DG ANKLE COMPLETE 3+V*R*
3 series · 3 of 3 positions shown · non-contrast
Comparison: None.

CLINICAL DATA: Right ankle pain and swelling.

EXAM:
RIGHT ANKLE - COMPLETE 3+ VIEW

[ankle ap]
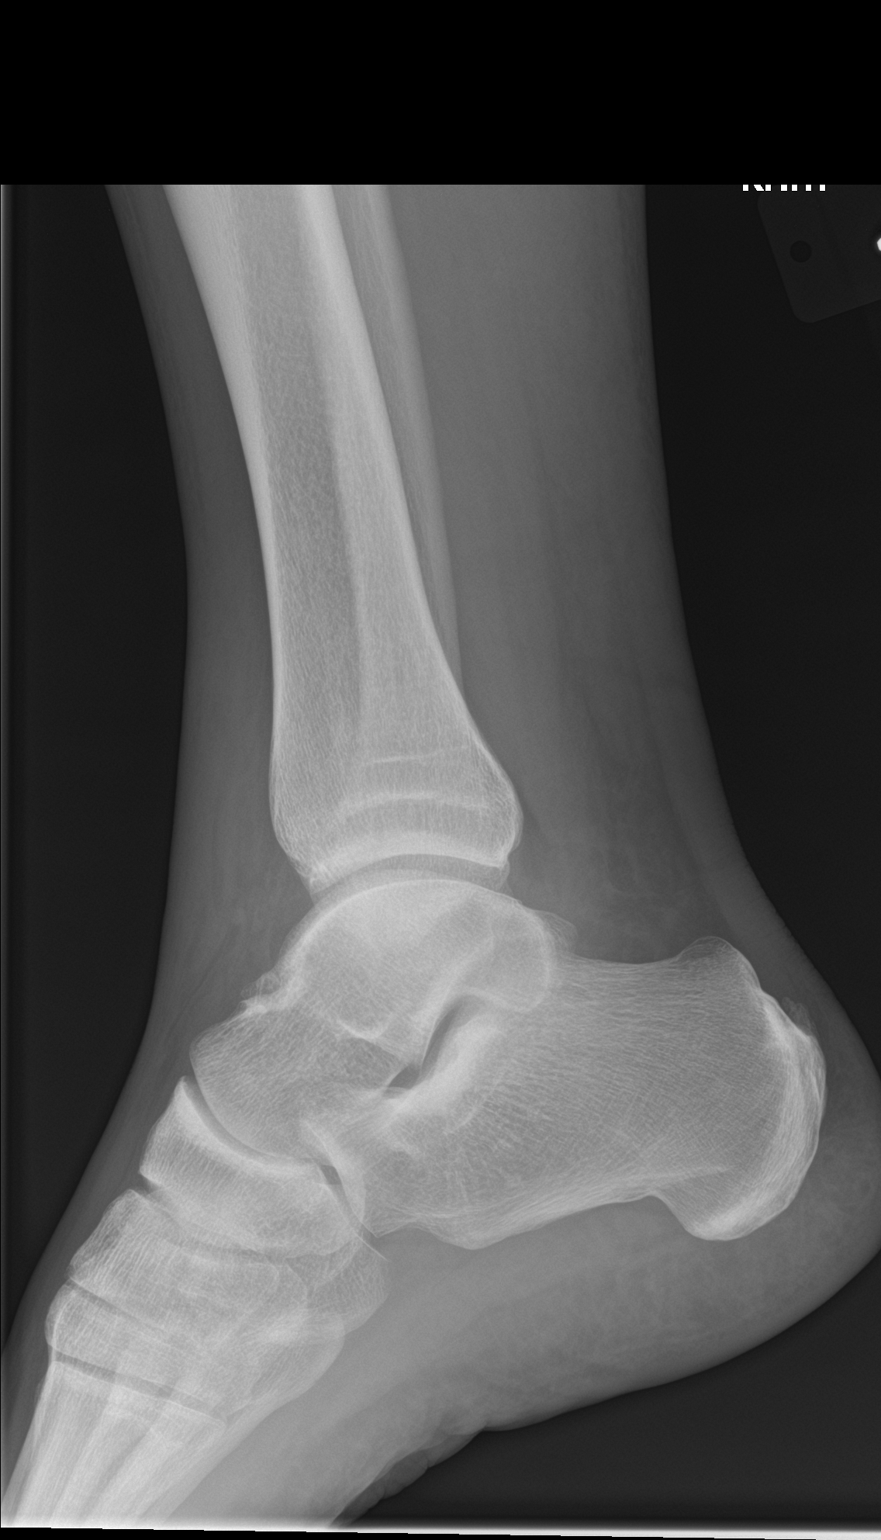

[ankle obl]
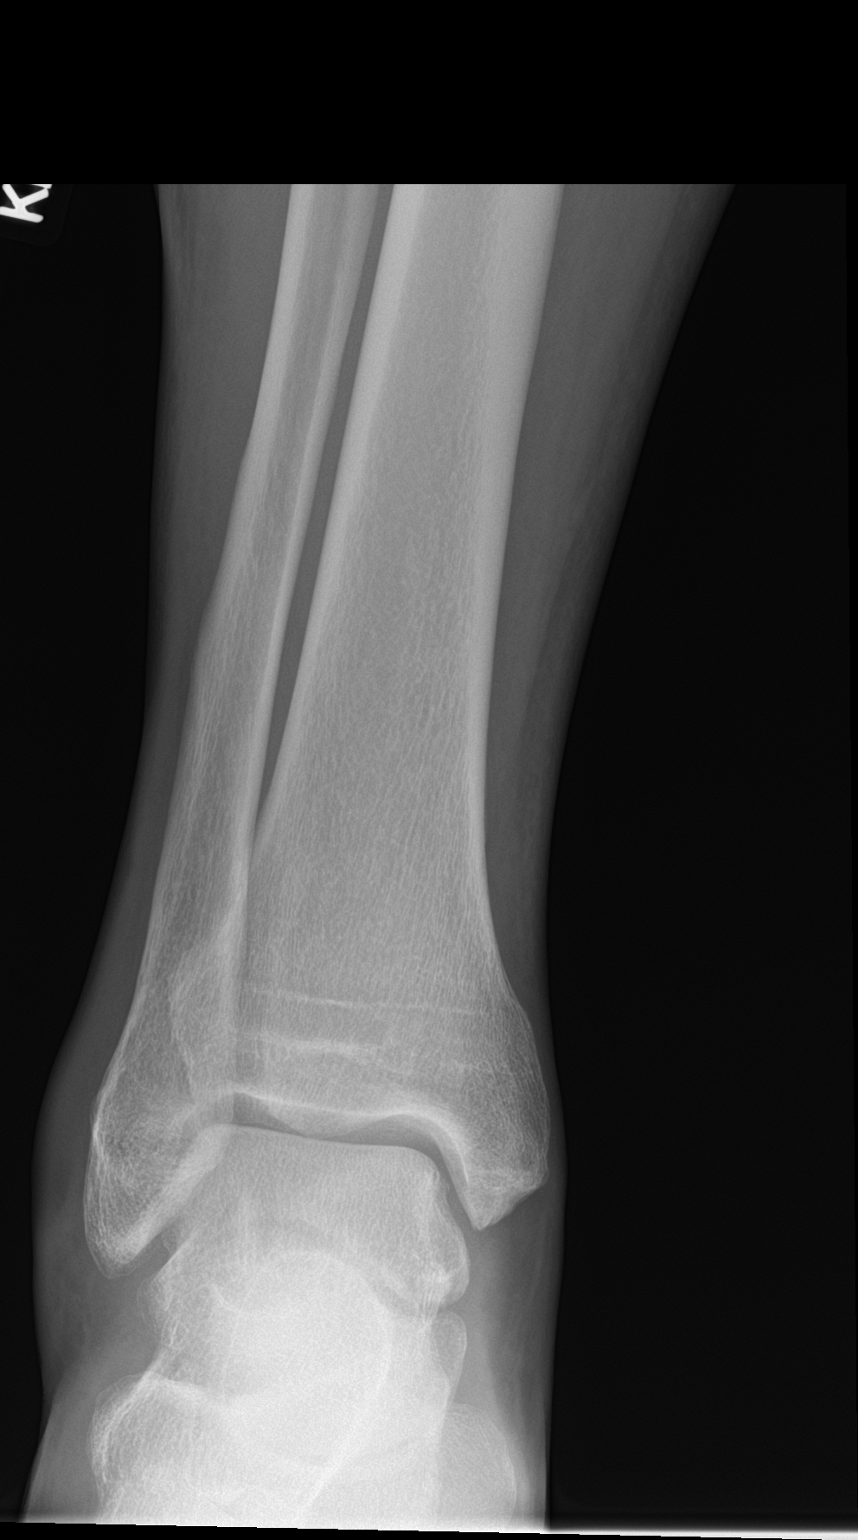

[ankle lat]
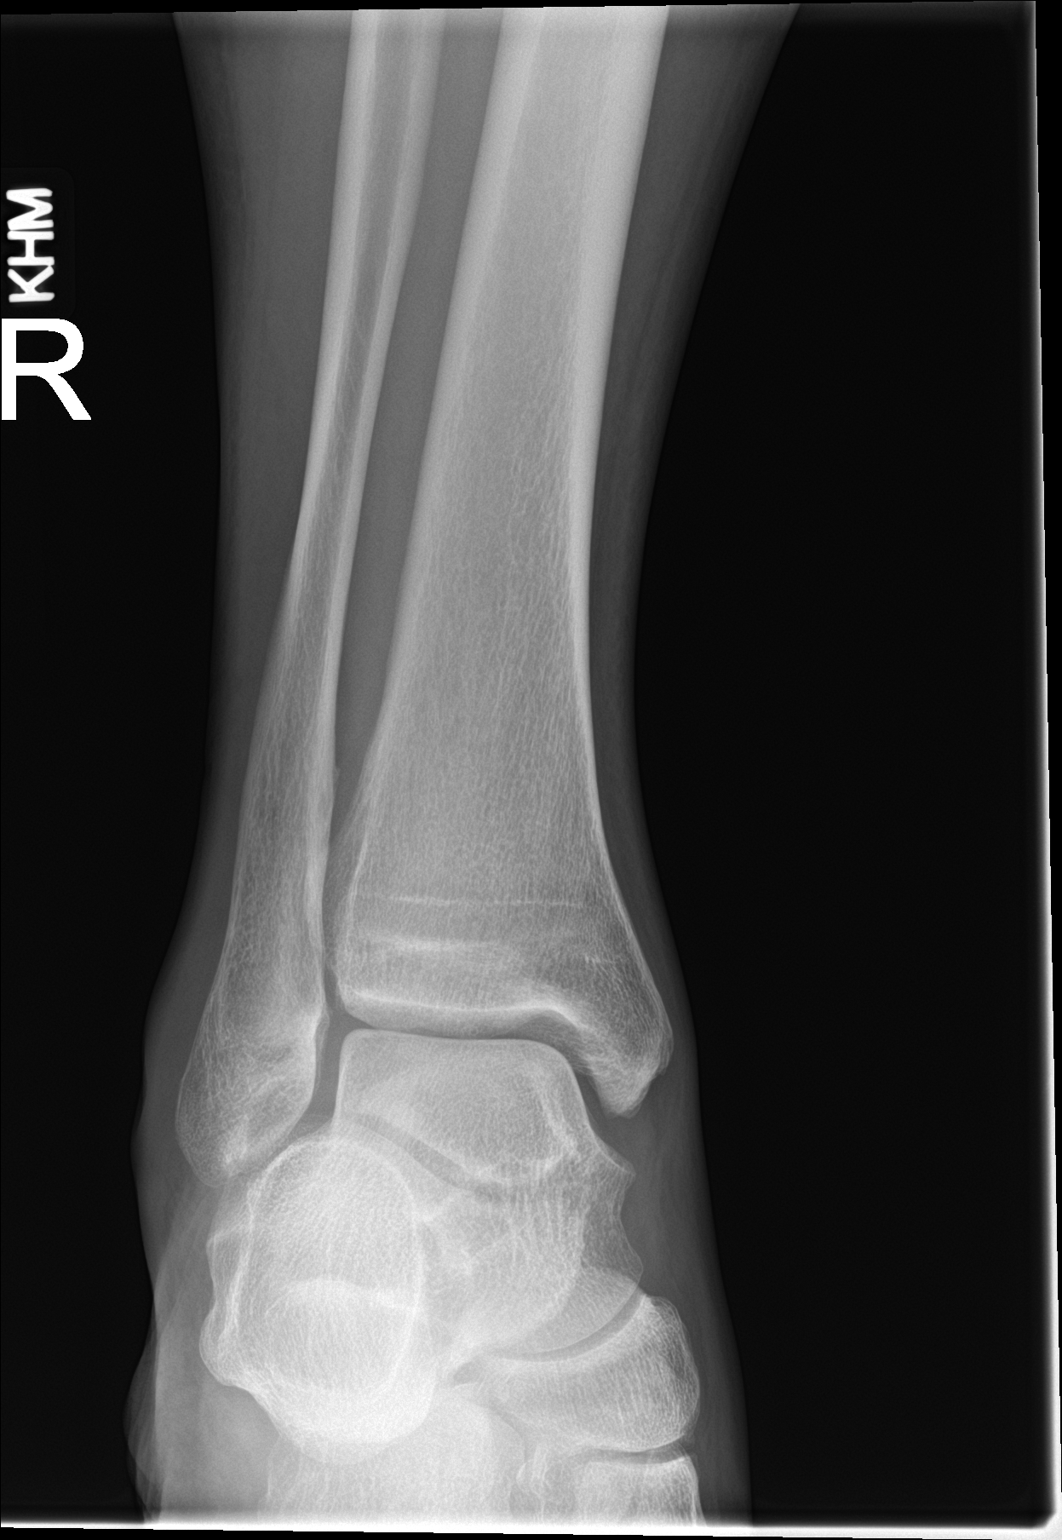

[3 of 3 positions shown; findings below may reference images not displayed]

FINDINGS: There is no evidence of fracture, dislocation, or joint effusion.
There is no evidence of arthropathy or other focal bone abnormality.
There is soft tissue swelling over the lateral malleolus.
IMPRESSION: No acute osseous injury of the right ankle.

## 2016-06-25 ENCOUNTER — Encounter: Payer: Self-pay | Admitting: Family Medicine

## 2016-06-25 ENCOUNTER — Telehealth: Payer: Self-pay | Admitting: Family Medicine

## 2016-06-25 NOTE — Telephone Encounter (Signed)
Pt notified he will need Pneumovax Verbalizes understanding

## 2016-06-26 ENCOUNTER — Ambulatory Visit (INDEPENDENT_AMBULATORY_CARE_PROVIDER_SITE_OTHER): Payer: BLUE CROSS/BLUE SHIELD

## 2016-06-26 DIAGNOSIS — Z23 Encounter for immunization: Secondary | ICD-10-CM | POA: Diagnosis not present

## 2016-07-03 ENCOUNTER — Encounter: Payer: Self-pay | Admitting: *Deleted

## 2016-08-05 ENCOUNTER — Ambulatory Visit: Payer: BLUE CROSS/BLUE SHIELD | Admitting: Family Medicine

## 2016-09-21 ENCOUNTER — Encounter: Payer: Self-pay | Admitting: Family Medicine

## 2016-09-21 ENCOUNTER — Ambulatory Visit (INDEPENDENT_AMBULATORY_CARE_PROVIDER_SITE_OTHER): Payer: BLUE CROSS/BLUE SHIELD | Admitting: Family Medicine

## 2016-09-21 VITALS — BP 131/80 | HR 68 | Temp 97.1°F | Ht 73.0 in | Wt 216.0 lb

## 2016-09-21 DIAGNOSIS — E7800 Pure hypercholesterolemia, unspecified: Secondary | ICD-10-CM

## 2016-09-21 DIAGNOSIS — Z0000 Encounter for general adult medical examination without abnormal findings: Secondary | ICD-10-CM | POA: Diagnosis not present

## 2016-09-21 DIAGNOSIS — R911 Solitary pulmonary nodule: Secondary | ICD-10-CM

## 2016-09-21 DIAGNOSIS — R030 Elevated blood-pressure reading, without diagnosis of hypertension: Secondary | ICD-10-CM

## 2016-09-21 DIAGNOSIS — F339 Major depressive disorder, recurrent, unspecified: Secondary | ICD-10-CM

## 2016-09-21 DIAGNOSIS — E559 Vitamin D deficiency, unspecified: Secondary | ICD-10-CM

## 2016-09-21 DIAGNOSIS — Z8546 Personal history of malignant neoplasm of prostate: Secondary | ICD-10-CM

## 2016-09-21 LAB — URINALYSIS, COMPLETE
BILIRUBIN UA: NEGATIVE
Glucose, UA: NEGATIVE
KETONES UA: NEGATIVE
LEUKOCYTES UA: NEGATIVE
Nitrite, UA: NEGATIVE
PROTEIN UA: NEGATIVE
RBC UA: NEGATIVE
SPEC GRAV UA: 1.015 (ref 1.005–1.030)
Urobilinogen, Ur: 0.2 mg/dL (ref 0.2–1.0)
pH, UA: 5 (ref 5.0–7.5)

## 2016-09-21 LAB — MICROSCOPIC EXAMINATION
Bacteria, UA: NONE SEEN
Epithelial Cells (non renal): NONE SEEN /hpf (ref 0–10)
RBC, UA: NONE SEEN /hpf (ref 0–?)
Renal Epithel, UA: NONE SEEN /hpf
WBC, UA: NONE SEEN /hpf (ref 0–?)

## 2016-09-21 NOTE — Patient Instructions (Addendum)
Continue current medications. Continue good therapeutic lifestyle changes which include good diet and exercise. Fall precautions discussed with patient. If an FOBT was given today- please return it to our front desk. If you are over 65 years old - you may need Prevnar 33 or the adult Pneumonia vaccine.  **Flu shots are available--- please call and schedule a FLU-CLINIC appointment**  After your visit with [place] today you will receive a survey in the mail or online from [employer] regarding your care with [place]. Please take a moment to fill this out. Your feedback is very important to [place] as you can help [place] better understand your patient needs as well as improve your experience and satisfaction. WE CARE ABOUT YOU!!!    We will arrange for you to get a CT scan because of the pulmonary nodule that has been found in the past. We'll call with the blood work result that is being done as soon as that blood work is reported

## 2016-09-21 NOTE — Progress Notes (Signed)
Subjective:    Patient ID: Spencer White, male    DOB: 1950-10-24, 66 y.o.   MRN: [id number]  HPI Patient is here today for annual wellness exam and follow up of chronic medical problems which includes hyperlipidemia and hypertension. He is taking medication regularly.The patient denies any chest pain or shortness of breath. He denies any problems with nausea vomiting diarrhea or blood in the stool. He does have a history of colon polyps and is scheduled for repeat colonoscopy in the spring of 2019. At the same time when he had his last colonoscopy he also had an esophageal stricture and was dilated for this. The patient notices now that he still has some problems with swallowing certain types of foods but is not severe at this point in time and prefers to wait until he gets his next colonoscopy to get another endoscopy and dilatation. He's passing his water without problems. He was supposed to get a repeat CT scan of his chest but unfortunately did not get this and we will schedule the CT scan of the chest when he leaves the office today. We can follow-up on the previous CT scan which had a pulmonary nodule.   Patient Active Problem List   Diagnosis Date Noted  . Malignant neoplasm of prostate ([place]) 03/28/2014  . Organic impotence 03/28/2014  . Anxiety 03/28/2014  . History of prostate cancer 07/12/2013  . Depression 07/12/2013  . Hyperlipidemia 07/12/2013  . Blood pressure elevated without history of HTN 07/12/2013   Outpatient Encounter Prescriptions as of 09/21/2016  Medication Sig  . venlafaxine XR (EFFEXOR-XR) 75 MG 24 hr capsule TAKE 1 CAPSULE (75 MG TOTAL) BY MOUTH DAILY WITH BREAKFAST.   No facility-administered encounter medications on file as of 09/21/2016.      Review of Systems  Constitutional: Negative.   HENT: Negative.   Eyes: Negative.   Respiratory: Negative.   Cardiovascular: Negative.   Gastrointestinal: Negative.   Endocrine: Negative.   Genitourinary:  Negative.   Musculoskeletal: Negative.   Skin: Negative.   Allergic/Immunologic: Negative.   Neurological: Negative.   Hematological: Negative.   Psychiatric/Behavioral: Negative.        Objective:   Physical Exam  Constitutional: He is oriented to person, place, and time. He appears well-developed and well-nourished. No distress.  HENT:  Head: Normocephalic and atraumatic.  Right Ear: External ear normal.  Left Ear: External ear normal.  Nose: Nose normal.  Mouth/Throat: Oropharynx is clear and moist. No oropharyngeal exudate.  Eyes: Conjunctivae and EOM are normal. Pupils are equal, round, and reactive to light. Right eye exhibits no discharge. Left eye exhibits no discharge. No scleral icterus.  Neck: Normal range of motion. Neck supple. No thyromegaly present.  Neck without bruits or thyromegaly  Cardiovascular: Normal rate, regular rhythm, normal heart sounds and intact distal pulses.   No murmur heard. The heart was regular at 72/m  Pulmonary/Chest: Effort normal and breath sounds normal. No respiratory distress. He has no wheezes. He has no rales. He exhibits no tenderness.  No axillary adenopathy chest is clear anteriorly and posteriorly  Abdominal: Soft. Bowel sounds are normal. He exhibits no mass. There is no tenderness. There is no rebound and no guarding.  No abdominal tenderness liver or spleen enlargement or inguinal adenopathy. No bruits.  Genitourinary: Rectum normal and penis normal.  Genitourinary Comments: The external genitalia were normal and no inguinal hernias were palpable The prostate vault is empty and there is no lumps or masses in the rectum.  Musculoskeletal: Normal range of motion. He exhibits no edema.  Lymphadenopathy:    He has no cervical adenopathy.  Neurological: He is alert and oriented to person, place, and time. He has normal reflexes. No cranial nerve deficit.  Skin: Skin is warm and dry. No rash noted.  Psychiatric: He has a normal mood  and affect. His behavior is normal. Judgment and thought content normal.  Nursing note and vitals reviewed.  BP 131/80 (BP Location: Left Arm)   Pulse 68   Temp 97.1 F (36.2 C) (Oral)   Ht 6\' 1"  (1.854 m)   Wt 216 lb (98 kg)   BMI 28.50 kg/m   KG today with results pending===      Assessment & Plan:  1. Annual physical exam -The patient is doing well overall. He has no specific complaints other than some increased problems with swallowing following a esophageal dilatation a couple of years ago. He is also due to have a repeat colonoscopy in the spring of next year. - EKG 12-Lead - Urinalysis, Complete  2. Prostate cancer -The patient has had a prostatectomy. His PSAs have been stable and less than .1 the last PSA was last checked in the summer of 2017. - Urinalysis, Complete - CT Chest Wo Contrast; Future  3. Vitamin D deficiency -Continue with vitamin D replacement pending results of lab work  4. Pure hypercholesterolemia -The patient has a history of elevated cholesterol he is currently not taking any medicines and trying to follow-up and get this down with diet and exercise.  5. Blood pressure elevated without history of HTN -The blood pressure that has been elevated in the past is normal today. He is not taking any medicines for this.  6. Pulmonary nodule -The patient was supposed to have gotten a repeat CT scan of the chest in September 2016 but for some reason did not get this. We will schedule this because of the pulmonary nodule finding in the history of prostate cancer in the past. - CT Chest Wo Contrast; Future  7. Depression -Continue with Effexor as currently doing.  Patient Instructions  Continue current medications. Continue good therapeutic lifestyle changes which include good diet and exercise. Fall precautions discussed with patient. If an FOBT was given today- please return it to our front desk. If you are over 56 years old - you may need Prevnar 53  or the adult Pneumonia vaccine.  **Flu shots are available--- please call and schedule a FLU-CLINIC appointment**  After your visit with [place] today you will receive a survey in the mail or online from [employer] regarding your care with [place]. Please take a moment to fill this out. Your feedback is very important to [place] as you can help [place] better understand your patient needs as well as improve your experience and satisfaction. WE CARE ABOUT YOU!!!    We will arrange for you to get a CT scan because of the pulmonary nodule that has been found in the past. We'll call with the blood work result that is being done as soon as that blood work is reported  Arrie Senate MD

## 2016-09-28 ENCOUNTER — Ambulatory Visit (HOSPITAL_COMMUNITY): Payer: BLUE CROSS/BLUE SHIELD

## 2016-09-30 ENCOUNTER — Encounter: Payer: Self-pay | Admitting: Family Medicine

## 2016-10-02 ENCOUNTER — Other Ambulatory Visit: Payer: BLUE CROSS/BLUE SHIELD

## 2016-10-02 DIAGNOSIS — R030 Elevated blood-pressure reading, without diagnosis of hypertension: Secondary | ICD-10-CM

## 2016-10-02 DIAGNOSIS — E7800 Pure hypercholesterolemia, unspecified: Secondary | ICD-10-CM

## 2016-10-02 DIAGNOSIS — Z8546 Personal history of malignant neoplasm of prostate: Secondary | ICD-10-CM

## 2016-10-02 DIAGNOSIS — E559 Vitamin D deficiency, unspecified: Secondary | ICD-10-CM

## 2016-10-02 DIAGNOSIS — Z0000 Encounter for general adult medical examination without abnormal findings: Secondary | ICD-10-CM

## 2016-10-03 LAB — BMP8+EGFR
BUN/Creatinine Ratio: 20 (ref 10–24)
BUN: 22 mg/dL (ref 8–27)
CO2: 24 mmol/L (ref 18–29)
Calcium: 9.3 mg/dL (ref 8.6–10.2)
Chloride: 101 mmol/L (ref 96–106)
Creatinine, Ser: 1.11 mg/dL (ref 0.76–1.27)
GFR, EST AFRICAN AMERICAN: 81 mL/min/{1.73_m2} (ref >59)
GFR, EST NON AFRICAN AMERICAN: 70 mL/min/{1.73_m2} (ref >59)
Glucose: 88 mg/dL (ref 65–99)
POTASSIUM: 4.7 mmol/L (ref 3.5–5.2)
Sodium: 138 mmol/L (ref 134–144)

## 2016-10-03 LAB — NMR, LIPOPROFILE
CHOLESTEROL: 188 mg/dL (ref 100–199)
HDL CHOLESTEROL BY NMR: 43 mg/dL (ref >39)
HDL PARTICLE NUMBER: 29.1 umol/L — AB (ref >=30.5)
LDL Particle Number: 1511 nmol/L — ABNORMAL HIGH (ref <1000)
LDL SIZE: 20.8 nm (ref >20.5)
LDL-C: 131 mg/dL — ABNORMAL HIGH (ref 0–99)
LP-IR Score: 42 (ref <=45)
SMALL LDL PARTICLE NUMBER: 329 nmol/L (ref <=527)
TRIGLYCERIDES BY NMR: 68 mg/dL (ref 0–149)

## 2016-10-03 LAB — CBC WITH DIFFERENTIAL/PLATELET
BASOS ABS: 0 10*3/uL (ref 0.0–0.2)
Basos: 0 %
EOS (ABSOLUTE): 0.4 10*3/uL (ref 0.0–0.4)
Eos: 7 %
HEMOGLOBIN: 15 g/dL (ref 13.0–17.7)
Hematocrit: 43.9 % (ref 37.5–51.0)
IMMATURE GRANS (ABS): 0 10*3/uL (ref 0.0–0.1)
Immature Granulocytes: 0 %
LYMPHS ABS: 1.3 10*3/uL (ref 0.7–3.1)
LYMPHS: 27 %
MCH: 32.8 pg (ref 26.6–33.0)
MCHC: 34.2 g/dL (ref 31.5–35.7)
MCV: 96 fL (ref 79–97)
MONOCYTES: 7 %
Monocytes Absolute: 0.3 10*3/uL (ref 0.1–0.9)
Neutrophils Absolute: 2.9 10*3/uL (ref 1.4–7.0)
Neutrophils: 59 %
PLATELETS: 290 10*3/uL (ref 150–379)
RBC: 4.57 x10E6/uL (ref 4.14–5.80)
RDW: 13.2 % (ref 12.3–15.4)
WBC: 4.9 10*3/uL (ref 3.4–10.8)

## 2016-10-03 LAB — PSA, TOTAL AND FREE
PSA, Free: <0.01 ng/mL
Prostate Specific Ag, Serum: <0.1 ng/mL (ref 0.0–4.0)

## 2016-10-03 LAB — HEPATIC FUNCTION PANEL
ALBUMIN: 4.1 g/dL (ref 3.6–4.8)
ALT: 24 IU/L (ref 0–44)
AST: 25 IU/L (ref 0–40)
Alkaline Phosphatase: 56 IU/L (ref 39–117)
BILIRUBIN TOTAL: 0.6 mg/dL (ref 0.0–1.2)
Bilirubin, Direct: 0.14 mg/dL (ref 0.00–0.40)
Total Protein: 6.5 g/dL (ref 6.0–8.5)

## 2016-10-03 LAB — VITAMIN D 25 HYDROXY (VIT D DEFICIENCY, FRACTURES): VIT D 25 HYDROXY: 77.3 ng/mL (ref 30.0–100.0)

## 2016-10-19 ENCOUNTER — Telehealth: Payer: Self-pay | Admitting: *Deleted

## 2016-10-19 ENCOUNTER — Ambulatory Visit (HOSPITAL_COMMUNITY): Payer: BLUE CROSS/BLUE SHIELD

## 2016-10-19 NOTE — Telephone Encounter (Signed)
Called pt - he will not get CT at [facility] - due to cost - he gave me info about a [place] facility = we will fax the order there and get the scan scheduled.  Fax/info  to be given to carlon today

## 2016-10-22 ENCOUNTER — Encounter: Payer: Self-pay | Admitting: Family Medicine

## 2016-10-27 ENCOUNTER — Encounter (INDEPENDENT_AMBULATORY_CARE_PROVIDER_SITE_OTHER): Payer: Self-pay

## 2016-10-28 ENCOUNTER — Encounter: Payer: Self-pay | Admitting: *Deleted

## 2016-10-28 ENCOUNTER — Encounter: Payer: Self-pay | Admitting: Family Medicine

## 2016-10-28 NOTE — Progress Notes (Signed)
I spoke with the patient today on the phone regarding his CT scan that was done at [facility]. The results of this CT scan reviewed through care every way and a copy of this was printed. It shows no evidence of metastatic disease in the chest. It did indicate the patient had a 4 mm triangular-shaped pulmonary nodules in 2 different locations favored to reflect benign fissural  lymph nodes. The patient has had prostate cancer. His history is also significant in that his brother had lung cancer and his mother had lung cancer. The patient has never smoked. The results also indicated calcification in the left anterior descending artery on the heart and a calcified granuloma in the left upper lobe and a tiny calcified granuloma in the left lower lobe. He also had a 1 cm simple fluid attenuating lesion in the hepatic area of the liver reflecting and hepatic cyst. All of this information was explained to the patient and he seemed to understand this. He was given a copy of the report which will be printed for him in the front office. He will be scheduled to see the pulmonologist for follow-up of these small nodules as well as an appointment with the cardiologist for further evaluation of the calcification in the left anterior descending artery. The patient understands this. My nurse will schedule these appointments for him.    Order for referrals placed today - 10/29/16-jhb

## 2016-10-29 ENCOUNTER — Other Ambulatory Visit: Payer: Self-pay | Admitting: *Deleted

## 2016-10-29 DIAGNOSIS — Z801 Family history of malignant neoplasm of trachea, bronchus and lung: Secondary | ICD-10-CM

## 2016-10-29 DIAGNOSIS — I2584 Coronary atherosclerosis due to calcified coronary lesion: Secondary | ICD-10-CM

## 2016-10-29 DIAGNOSIS — R918 Other nonspecific abnormal finding of lung field: Secondary | ICD-10-CM

## 2016-10-29 DIAGNOSIS — I2510 Atherosclerotic heart disease of native coronary artery without angina pectoris: Secondary | ICD-10-CM

## 2016-11-09 NOTE — Progress Notes (Signed)
Cardiology Office Note   Date:  11/10/2016   ID:  Spencer White, DOB [birth year], MRN [id number]  PCP:  Chipper Herb, MD  Cardiologist:   Minus Breeding, MD  Referring:  Chipper Herb, MD  Chief Complaint  Patient presents with  . Coronary Calcium      History of Present Illness: Spencer White is a 66 y.o. male who is referred by Dr. Laurance Flatten for evaluation of elevated coronary calcium score.    He has no past cardiac history.  He was noted recently, on screening for his prostate cancer, to have coronary calcium in his LAD.  He has had no past cardiac history although he does have risk factors.  The patient denies any new symptoms such as chest discomfort, neck or arm discomfort. There has been no new shortness of breath, PND or orthopnea. There have been no reported palpitations, presyncope or syncope.  Of note he is active working in the yard and has started going to the gym again.  With this level of activity he does not get symptoms.    Past Medical History:  Diagnosis Date  . Asthma   . BCC (basal cell carcinoma), scalp/neck   . Depression   . Hyperlipidemia   . Hypertension   . Prostate cancer ([place]) 2011  . Status post dilation of esophageal narrowing     Past Surgical History:  Procedure Laterality Date  . COLONOSCOPY    . PROSTATECTOMY  07/2009  . TONSILLECTOMY       Current Outpatient Prescriptions  Medication Sig Dispense Refill  . venlafaxine XR (EFFEXOR-XR) 75 MG 24 hr capsule TAKE 1 CAPSULE (75 MG TOTAL) BY MOUTH DAILY WITH BREAKFAST. 90 capsule 3   No current facility-administered medications for this visit.     Allergies:   Latex    Social History:  The patient  reports that he has never smoked. He has never used smokeless tobacco. He reports that he drinks alcohol. He reports that he does not use drugs.   Family History:  The patient's family history includes Heart disease (age of onset: 67) in his father; Hypertension in his brother and  father; Lung cancer in his brother and mother.    ROS:  Please see the history of present illness.   Otherwise, review of systems are positive for none.   All other systems are reviewed and negative.    PHYSICAL EXAM: VS:  BP 130/86   Pulse 73   Ht 6\' 1"  (1.854 m)   Wt 217 lb (98.4 kg)   BMI 28.63 kg/m  , BMI Body mass index is 28.63 kg/m. GENERAL:  Well appearing HEENT:  Pupils equal round and reactive, fundi not visualized, oral mucosa unremarkable NECK:  No jugular venous distention, waveform within normal limits, carotid upstroke brisk and symmetric, no bruits, no thyromegaly LYMPHATICS:  No cervical, inguinal adenopathy LUNGS:  Clear to auscultation bilaterally BACK:  No CVA tenderness CHEST:  Unremarkable HEART:  PMI not displaced or sustained,S1 and S2 within normal limits, no S3, no S4, no clicks, no rubs, no murmurs ABD:  Flat, positive bowel sounds normal in frequency in pitch, no bruits, no rebound, no guarding, no midline pulsatile mass, no hepatomegaly, no splenomegaly EXT:  2 plus pulses throughout, no edema, no cyanosis no clubbing SKIN:  No rashes no nodules NEURO:  Cranial nerves II through XII grossly intact, motor grossly intact throughout PSYCH:  Cognitively intact, oriented to person place and time  EKG:  EKG is not ordered today. The ekg ordered 09/21/16 demonstrates NSR, rate 60, axis WNL, intervals WNL, no acute ST T wave changes.     Recent Labs: 10/02/2016: ALT 24; BUN 22; Creatinine, Ser 1.11; Platelets 290; Potassium 4.7; Sodium 138    Lipid Panel    Component Value Date/Time   CHOL 188 10/02/2016 1024   TRIG 68 10/02/2016 1024   HDL 43 10/02/2016 1024   LDLCALC 119 (H) 09/04/2013 0948      Wt Readings from Last 3 Encounters:  11/10/16 217 lb (98.4 kg)  09/21/16 216 lb (98 kg)  01/21/16 215 lb (97.5 kg)      Other studies Reviewed: Additional studies/ records that were reviewed today include: [place] hospital  records. Review of the above records demonstrates:  Please see elsewhere in the note.     ASSESSMENT AND PLAN:  ELEVATED CORONARY CALCIUM:  I will bring the patient back for a POET (Plain Old Exercise Test). This will allow me to screen for obstructive coronary disease, risk stratify and very importantly provide a prescription for exercise.  DYSLIPIDEMIA:  His LDL was 131 with HDL 13.1.  He prefers to do diet and exercise before considering a statin.  We talked about current guidelines  HTN: The blood pressure is at target. No change in medications is indicated. We will continue with therapeutic lifestyle changes (TLC).   Current medicines are reviewed at length with the patient today.  The patient does not have concerns regarding medicines.  The following changes have been made:  no change  Labs/ tests ordered today include:   Orders Placed This Encounter  Procedures  . Exercise Tolerance Test     Disposition:   FU with as needed based on the results of the above.     Signed, Minus Breeding, MD  11/10/2016 4:25 PM    [facility]

## 2016-11-10 ENCOUNTER — Ambulatory Visit (INDEPENDENT_AMBULATORY_CARE_PROVIDER_SITE_OTHER): Payer: BLUE CROSS/BLUE SHIELD | Admitting: Cardiology

## 2016-11-10 ENCOUNTER — Encounter: Payer: Self-pay | Admitting: Cardiology

## 2016-11-10 VITALS — BP 130/86 | HR 73 | Ht 73.0 in | Wt 217.0 lb

## 2016-11-10 DIAGNOSIS — I10 Essential (primary) hypertension: Secondary | ICD-10-CM

## 2016-11-10 DIAGNOSIS — R931 Abnormal findings on diagnostic imaging of heart and coronary circulation: Secondary | ICD-10-CM | POA: Insufficient documentation

## 2016-11-10 DIAGNOSIS — E785 Hyperlipidemia, unspecified: Secondary | ICD-10-CM

## 2016-11-10 NOTE — Patient Instructions (Signed)
Medication Instructions: No changes   Procedures/Testing: Your physician has requested that you have an exercise tolerance test. For further information please visit [email or web address]. Please also follow instruction sheet, as given. This will take place at [place], [address]  Follow-Up: Follow up as needed    If you need a refill on your cardiac medications before your next appointment, please call your pharmacy.   Exercise Stress Electrocardiogram An exercise stress electrocardiogram is a test that is done to evaluate the blood supply to your heart. This test may also be called exercise stress electrocardiography. The test is done while you are walking on a treadmill. The goal of this test is to raise your heart rate. This test is done to find areas of poor blood flow to the heart by determining the extent of coronary artery disease (CAD). CAD is defined as narrowing in one or more heart (coronary) arteries of more than 70%. If you have an abnormal test result, this may mean that you are not getting adequate blood flow to your heart during exercise. Additional testing may be needed to understand why your test was abnormal. Tell a health care provider about:  Any allergies you have.  All medicines you are taking, including vitamins, herbs, eye drops, creams, and over-the-counter medicines.  Any problems you or family members have had with anesthetic medicines.  Any blood disorders you have.  Any surgeries you have had.  Any medical conditions you have.  Possibility of pregnancy, if this applies. What are the risks? Generally, this is a safe procedure. However, as with any procedure, complications can occur. Possible complications can include:  Pain or pressure in the following areas:  Chest.  Jaw or neck.  Between your shoulder blades.  Radiating down your left arm.  Dizziness or light-headedness.  Shortness of breath.  Increased or irregular  heartbeats.  Nausea or vomiting.  Heart attack (rare). What happens before the procedure?  Avoid all forms of caffeine 24 hours before your test or as directed by your health care provider. This includes coffee, tea (even decaffeinated tea), caffeinated sodas, chocolate, cocoa, and certain pain medicines.  Follow your health care provider's instructions regarding eating and drinking before the test.  Take your medicines as directed at regular times with water unless instructed otherwise. Exceptions may include:  If you have diabetes, ask how you are to take your insulin or pills. It is common to adjust insulin dosing the morning of the test.  If you are taking beta-blocker medicines, it is important to talk to your health care provider about these medicines well before the date of your test. Taking beta-blocker medicines may interfere with the test. In some cases, these medicines need to be changed or stopped 24 hours or more before the test.  If you wear a nitroglycerin patch, it may need to be removed prior to the test. Ask your health care provider if the patch should be removed before the test.  If you use an inhaler for any breathing condition, bring it with you to the test.  If you are an outpatient, bring a snack so you can eat right after the stress phase of the test.  Do not smoke for 4 hours prior to the test or as directed by your health care provider.  Do not apply lotions, powders, creams, or oils on your chest prior to the test.  Wear loose-fitting clothes and comfortable shoes for the test. This test involves walking on a treadmill. What happens  during the procedure?  Multiple patches (electrodes) will be put on your chest. If needed, small areas of your chest may have to be shaved to get better contact with the electrodes. Once the electrodes are attached to your body, multiple wires will be attached to the electrodes and your heart rate will be monitored.  Your heart  will be monitored both at rest and while exercising.  You will walk on a treadmill. The treadmill will be started at a slow pace. The treadmill speed and incline will gradually be increased to raise your heart rate. What happens after the procedure?  Your heart rate and blood pressure will be monitored after the test.  You may return to your normal schedule including diet, activities, and medicines, unless your health care provider tells you otherwise. This information is not intended to replace advice given to you by your health care provider. Make sure you discuss any questions you have with your health care provider. Document Released: 06/12/2000 Document Revised: 11/21/2015 Document Reviewed: 02/20/2013 Elsevier Interactive Patient Education  2017 [employer].

## 2016-12-02 ENCOUNTER — Inpatient Hospital Stay (HOSPITAL_COMMUNITY): Admission: RE | Admit: 2016-12-02 | Payer: BLUE CROSS/BLUE SHIELD | Source: Ambulatory Visit

## 2016-12-16 ENCOUNTER — Telehealth (HOSPITAL_COMMUNITY): Payer: Self-pay

## 2016-12-16 NOTE — Telephone Encounter (Signed)
Encounter complete. 

## 2016-12-23 ENCOUNTER — Ambulatory Visit (HOSPITAL_COMMUNITY)
Admission: RE | Admit: 2016-12-23 | Discharge: 2016-12-23 | Disposition: A | Discharge status: Home / Self Care | Payer: BLUE CROSS/BLUE SHIELD | Source: Ambulatory Visit | Attending: Internal Medicine | Admitting: Internal Medicine

## 2016-12-23 DIAGNOSIS — R931 Abnormal findings on diagnostic imaging of heart and coronary circulation: Secondary | ICD-10-CM

## 2016-12-23 LAB — EXERCISE TOLERANCE TEST
CHL CUP MPHR: 156 {beats}/min
CHL RATE OF PERCEIVED EXERTION: 18
CSEPHR: 103 %
Estimated workload: 13 METS
Exercise duration (min): 10 min
Exercise duration (sec): 49 s
Peak HR: 162 {beats}/min
Rest HR: 60 {beats}/min

## 2016-12-29 ENCOUNTER — Encounter: Payer: Self-pay | Admitting: Family Medicine

## 2017-03-23 ENCOUNTER — Ambulatory Visit: Payer: BLUE CROSS/BLUE SHIELD | Admitting: Family Medicine

## 2017-05-06 ENCOUNTER — Ambulatory Visit (INDEPENDENT_AMBULATORY_CARE_PROVIDER_SITE_OTHER): Payer: BLUE CROSS/BLUE SHIELD

## 2017-05-06 DIAGNOSIS — Z23 Encounter for immunization: Secondary | ICD-10-CM | POA: Diagnosis not present

## 2017-05-07 ENCOUNTER — Encounter: Payer: Self-pay | Admitting: Family Medicine

## 2017-05-07 MED ORDER — VENLAFAXINE HCL ER 75 MG PO CP24: 75.0000 mg | ORAL_CAPSULE | Freq: Every day | ORAL | 0 refills | Status: DC

## 2017-07-21 ENCOUNTER — Ambulatory Visit: Payer: BLUE CROSS/BLUE SHIELD | Admitting: Family Medicine

## 2017-08-13 ENCOUNTER — Encounter: Payer: Self-pay | Admitting: Family Medicine

## 2017-08-13 ENCOUNTER — Other Ambulatory Visit: Payer: Self-pay | Admitting: Nurse Practitioner

## 2017-08-13 MED ORDER — VENLAFAXINE HCL ER 75 MG PO CP24: 75.0000 mg | ORAL_CAPSULE | Freq: Every day | ORAL | 1 refills | Status: DC

## 2017-08-13 NOTE — Progress Notes (Signed)
refilled venlafaxine for 90 days with 1 refill

## 2017-08-24 ENCOUNTER — Ambulatory Visit: Payer: BLUE CROSS/BLUE SHIELD | Admitting: Family Medicine

## 2017-09-15 ENCOUNTER — Ambulatory Visit: Payer: BLUE CROSS/BLUE SHIELD | Admitting: Family Medicine

## 2017-09-23 ENCOUNTER — Encounter: Payer: Self-pay | Admitting: Internal Medicine

## 2017-10-28 ENCOUNTER — Ambulatory Visit: Payer: BLUE CROSS/BLUE SHIELD | Admitting: Family Medicine

## 2017-11-10 DIAGNOSIS — C4441 Basal cell carcinoma of skin of scalp and neck: Secondary | ICD-10-CM | POA: Diagnosis not present

## 2017-11-10 DIAGNOSIS — Z1283 Encounter for screening for malignant neoplasm of skin: Secondary | ICD-10-CM | POA: Diagnosis not present

## 2017-11-10 DIAGNOSIS — Z08 Encounter for follow-up examination after completed treatment for malignant neoplasm: Secondary | ICD-10-CM | POA: Diagnosis not present

## 2017-11-10 DIAGNOSIS — Z85828 Personal history of other malignant neoplasm of skin: Secondary | ICD-10-CM | POA: Diagnosis not present

## 2017-11-10 DIAGNOSIS — B0089 Other herpesviral infection: Secondary | ICD-10-CM | POA: Diagnosis not present

## 2017-11-17 ENCOUNTER — Ambulatory Visit: Payer: BLUE CROSS/BLUE SHIELD | Admitting: Family Medicine

## 2017-11-25 ENCOUNTER — Other Ambulatory Visit: Payer: Medicare HMO

## 2017-11-25 DIAGNOSIS — E7800 Pure hypercholesterolemia, unspecified: Secondary | ICD-10-CM

## 2017-11-25 DIAGNOSIS — R030 Elevated blood-pressure reading, without diagnosis of hypertension: Secondary | ICD-10-CM | POA: Diagnosis not present

## 2017-11-25 DIAGNOSIS — E559 Vitamin D deficiency, unspecified: Secondary | ICD-10-CM | POA: Diagnosis not present

## 2017-11-25 DIAGNOSIS — Z8546 Personal history of malignant neoplasm of prostate: Secondary | ICD-10-CM | POA: Diagnosis not present

## 2017-11-26 LAB — BMP8+EGFR
BUN / CREAT RATIO: 20 (ref 10–24)
BUN: 20 mg/dL (ref 8–27)
CALCIUM: 9.6 mg/dL (ref 8.6–10.2)
CHLORIDE: 102 mmol/L (ref 96–106)
CO2: 23 mmol/L (ref 20–29)
Creatinine, Ser: 1.02 mg/dL (ref 0.76–1.27)
GFR calc Af Amer: 89 mL/min/{1.73_m2} (ref >59)
GFR calc non Af Amer: 77 mL/min/{1.73_m2} (ref >59)
GLUCOSE: 91 mg/dL (ref 65–99)
Potassium: 4.7 mmol/L (ref 3.5–5.2)
Sodium: 138 mmol/L (ref 134–144)

## 2017-11-26 LAB — HEPATIC FUNCTION PANEL
ALBUMIN: 4.3 g/dL (ref 3.6–4.8)
ALT: 23 IU/L (ref 0–44)
AST: 26 IU/L (ref 0–40)
Alkaline Phosphatase: 55 IU/L (ref 39–117)
Bilirubin Total: 0.9 mg/dL (ref 0.0–1.2)
Bilirubin, Direct: 0.18 mg/dL (ref 0.00–0.40)
Total Protein: 6.3 g/dL (ref 6.0–8.5)

## 2017-11-26 LAB — CBC WITH DIFFERENTIAL/PLATELET
BASOS ABS: 0 10*3/uL (ref 0.0–0.2)
Basos: 0 %
EOS (ABSOLUTE): 0.2 10*3/uL (ref 0.0–0.4)
Eos: 5 %
Hematocrit: 44 % (ref 37.5–51.0)
Hemoglobin: 14.7 g/dL (ref 13.0–17.7)
IMMATURE GRANS (ABS): 0 10*3/uL (ref 0.0–0.1)
IMMATURE GRANULOCYTES: 0 %
LYMPHS: 29 %
Lymphocytes Absolute: 1.3 10*3/uL (ref 0.7–3.1)
MCH: 32.3 pg (ref 26.6–33.0)
MCHC: 33.4 g/dL (ref 31.5–35.7)
MCV: 97 fL (ref 79–97)
Monocytes Absolute: 0.5 10*3/uL (ref 0.1–0.9)
Monocytes: 10 %
NEUTROS PCT: 56 %
Neutrophils Absolute: 2.5 10*3/uL (ref 1.4–7.0)
PLATELETS: 281 10*3/uL (ref 150–450)
RBC: 4.55 x10E6/uL (ref 4.14–5.80)
RDW: 13.7 % (ref 12.3–15.4)
WBC: 4.5 10*3/uL (ref 3.4–10.8)

## 2017-11-26 LAB — NMR, LIPOPROFILE
CHOLESTEROL, TOTAL: 205 mg/dL — AB (ref 100–199)
HDL PARTICLE NUMBER: 28.8 umol/L — AB (ref >=30.5)
HDL-C: 48 mg/dL (ref >39)
LDL Particle Number: 1840 nmol/L — ABNORMAL HIGH (ref <1000)
LDL SIZE: 20.8 nm (ref >20.5)
LDL-C: 143 mg/dL — ABNORMAL HIGH (ref 0–99)
LP-IR Score: 37 (ref <=45)
SMALL LDL PARTICLE NUMBER: 873 nmol/L — AB (ref <=527)
Triglycerides: 72 mg/dL (ref 0–149)

## 2017-11-26 LAB — PSA, TOTAL AND FREE
PSA, Free: <0.01 ng/mL
Prostate Specific Ag, Serum: <0.1 ng/mL (ref 0.0–4.0)

## 2017-11-26 LAB — VITAMIN D 25 HYDROXY (VIT D DEFICIENCY, FRACTURES): VIT D 25 HYDROXY: 105 ng/mL — AB (ref 30.0–100.0)

## 2017-11-29 ENCOUNTER — Encounter: Payer: Self-pay | Admitting: Family Medicine

## 2017-11-29 ENCOUNTER — Ambulatory Visit (INDEPENDENT_AMBULATORY_CARE_PROVIDER_SITE_OTHER): Payer: Medicare HMO | Admitting: Family Medicine

## 2017-11-29 VITALS — BP 122/70 | HR 59 | Temp 97.6°F | Ht 73.0 in | Wt 214.0 lb

## 2017-11-29 DIAGNOSIS — R918 Other nonspecific abnormal finding of lung field: Secondary | ICD-10-CM | POA: Diagnosis not present

## 2017-11-29 DIAGNOSIS — E7800 Pure hypercholesterolemia, unspecified: Secondary | ICD-10-CM

## 2017-11-29 DIAGNOSIS — E559 Vitamin D deficiency, unspecified: Secondary | ICD-10-CM

## 2017-11-29 DIAGNOSIS — Z23 Encounter for immunization: Secondary | ICD-10-CM

## 2017-11-29 DIAGNOSIS — R030 Elevated blood-pressure reading, without diagnosis of hypertension: Secondary | ICD-10-CM

## 2017-11-29 DIAGNOSIS — Z8546 Personal history of malignant neoplasm of prostate: Secondary | ICD-10-CM

## 2017-11-29 DIAGNOSIS — Z801 Family history of malignant neoplasm of trachea, bronchus and lung: Secondary | ICD-10-CM | POA: Insufficient documentation

## 2017-11-29 LAB — URINALYSIS, COMPLETE
BILIRUBIN UA: NEGATIVE
Glucose, UA: NEGATIVE
KETONES UA: NEGATIVE
Leukocytes, UA: NEGATIVE
Nitrite, UA: NEGATIVE
PH UA: 6.5 (ref 5.0–7.5)
PROTEIN UA: NEGATIVE
RBC UA: NEGATIVE
SPEC GRAV UA: 1.015 (ref 1.005–1.030)
UUROB: 0.2 mg/dL (ref 0.2–1.0)

## 2017-11-29 LAB — MICROSCOPIC EXAMINATION
Bacteria, UA: NONE SEEN
EPITHELIAL CELLS (NON RENAL): NONE SEEN /HPF (ref 0–10)
RBC, UA: NONE SEEN /hpf (ref 0–2)
RENAL EPITHEL UA: NONE SEEN /HPF
WBC UA: NONE SEEN /HPF (ref 0–5)

## 2017-11-29 MED ORDER — VENLAFAXINE HCL ER 75 MG PO CP24: 75.0000 mg | ORAL_CAPSULE | Freq: Every day | ORAL | 3 refills | Status: DC

## 2017-11-29 MED ORDER — ROSUVASTATIN CALCIUM 10 MG PO TABS: 10.0000 mg | ORAL_TABLET | Freq: Every day | ORAL | 3 refills | Status: DC

## 2017-11-29 NOTE — Patient Instructions (Addendum)
Medicare Annual Wellness Visit  [facility] and the medical providers at [facility] strive to bring you the best medical care.  In doing so we not only want to address your current medical conditions and concerns but also to detect new conditions early and prevent illness, disease and health-related problems.    Medicare offers a yearly Wellness Visit which allows our clinical staff to assess your need for preventative services including immunizations, lifestyle education, counseling to decrease risk of preventable diseases and screening for fall risk and other medical concerns.    This visit is provided free of charge (no copay) for all Medicare recipients. The clinical pharmacists at [facility] have begun to conduct these Wellness Visits which will also include a thorough review of all your medications.    As you primary medical provider recommend that you make an appointment for your Annual Wellness Visit if you have not done so already this year.  You may set up this appointment before you leave today or you may call back ([phone]) and schedule an appointment.  Please make sure when you call that you mention that you are scheduling your Annual Wellness Visit with the clinical pharmacist so that the appointment may be made for the proper length of time.     Continue current medications. Continue good therapeutic lifestyle changes which include good diet and exercise. Fall precautions discussed with patient. If an FOBT was given today- please return it to our front desk. If you are over 11 years old - you may need Prevnar 5 or the adult Pneumonia vaccine.  **Flu shots are available--- please call and schedule a FLU-CLINIC appointment**  After your visit with [place] today you will receive a survey in the mail or online from [employer] regarding your care with [place]. Please take a moment to fill this out. Your feedback is very  important to [place] as you can help [place] better understand your patient needs as well as improve your experience and satisfaction. WE CARE ABOUT YOU!!!   Please schedule visit with ophthalmologist when that time comes up Please call Dr. Hilarie Fredrickson and make arrangements to get the repeat colonoscopy and tell him that you are having more trouble swallowing so that he can possibly do an endoscopy at the same time We will also be making you an appointment with a pulmonologist because of a family history of lung cancer and because of the fact that you have 2 pulmonary nodules We will discuss with the radiologist about repeating your CT scan of the chest and if that has not been done by the time you see the pulmonologist he may have to get that particular repeat done It is important for you to schedule your ophthalmology visit at the appropriate time Start taking Crestor 10 mg at bedtime Continue to follow aggressive therapeutic lifestyle changes including diet exercise You will need to have a lipid liver panel done in about 6 weeks after starting the Crestor to make sure that it has no adverse effect on your liver function test If you develop any muscle aches or pains from your baseline you should stop the medicine and call us Reduce the vitamin D and take 5000 units Monday through Friday only

## 2017-11-29 NOTE — Progress Notes (Signed)
Subjective:    Patient ID: Spencer White, male    DOB: [birth year], 67 y.o.   MRN: [id number]  HPI  Pt here for follow up and management of chronic medical problems which includes hyperlipidemia. He is taking medication regularly.  The patient is doing well overall and has no specific complaints.  He continues to take his Effexor.  He is on no other medicines currently.  He is due to get his pneumonia shot today.  He has had a Prevnar vaccine.  He has had blood work done and this will be reviewed with him during the visit today.  His PSA remains less than 0.1 and this is been stable and this is good over the past few years since his prostatectomy.  Cholesterol numbers with advanced lipid testing have a total LDL particle number that is higher than it was a year ago and is now 1840 and should be less than 1000.  The LDL C is also more elevated.  Triglycerides are good.  The good cholesterol or HDL particle number is low.  The small LDL particle number is also more elevated than previously.  The vitamin D level is 105 and we will have him reduce his vitamin D intake.  All liver function tests were normal and the CBC was normal as well as patient's electrolytes including his blood sugar creatinine and sodium and potassium and chloride.  The patient is doing well overall.  His cholesterol numbers are still up and we are encouraging him to go ahead and get started on a statin drug as well as continue with aggressive therapeutic lifestyle changes.  He denies any chest pain or shortness of breath.  He denies any problems with his intestinal tract other than he is having some more dysphagia especially with dried meat like chicken and [place].  This does not happen all the time but it is happening more frequently.  He has not seen any blood in the stool or had any change in bowel habits or had any black tarry stools.  He is due for a repeat colonoscopy because of precancerous polyps on his previous colonoscopy.  He  is not having any trouble with passing his water incontinence or leakage.  His immunizations were reviewed and he is due to get his Pneumovax and will also possibly be due to get the new Shingrix shot series.  He plans to get his eye exam done.  And I encouraged him also to set up his appointment to get his colonoscopy done as precancerous polyps or not something that he should mess around with.    Patient Active Problem List   Diagnosis Date Noted  . Elevated coronary artery calcium score 11/10/2016  . Dyslipidemia 11/10/2016  . Essential hypertension 11/10/2016  . Malignant neoplasm of prostate ([place]) 03/28/2014  . Organic impotence 03/28/2014  . Anxiety 03/28/2014  . History of prostate cancer 07/12/2013  . Depression 07/12/2013  . Hyperlipidemia 07/12/2013  . Blood pressure elevated without history of HTN 07/12/2013   Outpatient Encounter Medications as of 11/29/2017  Medication Sig  . venlafaxine XR (EFFEXOR-XR) 75 MG 24 hr capsule Take 1 capsule (75 mg total) by mouth daily with breakfast.   No facility-administered encounter medications on file as of 11/29/2017.      Review of Systems  Constitutional: Negative.   HENT: Negative.   Eyes: Negative.   Respiratory: Negative.   Cardiovascular: Negative.   Gastrointestinal: Negative.   Endocrine: Negative.   Genitourinary: Negative.  Musculoskeletal: Negative.   Skin: Negative.   Allergic/Immunologic: Negative.   Neurological: Negative.   Hematological: Negative.   Psychiatric/Behavioral: Negative.        Objective:   Physical Exam  Constitutional: He is oriented to person, place, and time. He appears well-developed and well-nourished. No distress.  Is pleasant and alert  HENT:  Head: Normocephalic and atraumatic.  Nose: Nose normal.  Mouth/Throat: Oropharynx is clear and moist. No oropharyngeal exudate.  He has bilateral ear cerumen  Eyes: Pupils are equal, round, and reactive to light. Conjunctivae and EOM are  normal. Right eye exhibits no discharge. Left eye exhibits no discharge. No scleral icterus.  Usually gets his eye exams done yearly and plans to continue to keep doing this.  Neck: Normal range of motion. Neck supple. No thyromegaly present.  No bruits thyromegaly or anterior cervical adenopathy  Cardiovascular: Normal rate, regular rhythm, normal heart sounds and intact distal pulses.  No murmur heard. Heart is regular at 60/min  Pulmonary/Chest: Effort normal and breath sounds normal. No respiratory distress. He has no wheezes. He has no rales. He exhibits no tenderness.  Clear anteriorly and posteriorly and no axillary adenopathy and no chest wall masses  Abdominal: Soft. Bowel sounds are normal. He exhibits no mass. There is no tenderness.  No abdominal tenderness liver or spleen enlargement masses bruits or inguinal adenopathy  Genitourinary: Rectum normal and penis normal.  Genitourinary Comments: The rectal exam was negative and the prostate has been removed and no lumps or masses were palpated.  The external genitalia were within normal limits with no inguinal hernias being palpated and no testicular masses.  Musculoskeletal: Normal range of motion. He exhibits no edema.  Lymphadenopathy:    He has no cervical adenopathy.  Neurological: He is alert and oriented to person, place, and time. He has normal reflexes. No cranial nerve deficit.  Skin: Skin is warm and dry. No rash noted.  Psychiatric: He has a normal mood and affect. His behavior is normal. Judgment and thought content normal.  Patient has a normal mood and affect and behavior.  Nursing note and vitals reviewed.   BP 122/70 (BP Location: Left Arm)   Pulse (!) 59   Temp 97.6 F (36.4 C) (Oral)   Ht 6\' 1"  (1.854 m)   Wt 214 lb (97.1 kg)   BMI 28.23 kg/m        Assessment & Plan:  1. Prostate cancer -Everything appears to be stable with a PSA test of less than 0.1. - Urinalysis, Complete - Ambulatory referral to  Pulmonology  2. Pure hypercholesterolemia -The patient has hyperlipidemia and has not been willing to take any statin drugs.  His brother has hyperlipidemia and so did his father.  I did encourage him today to start the Crestor and continue with as aggressive therapeutic lifestyle changes as possible including diet and exercise. - Lipid panel; Future - Hepatic function panel; Future  3. Vitamin D deficiency -The vitamin D level was elevated he will reduce his vitamin D to 5000 units Monday through Friday only  4. Blood pressure elevated without history of HTN -The blood pressure today is good he will continue to watch sodium intake  5. Family history of lung cancer -We will discuss with the radiologist regarding the need for repeating his CT scan but at the same time make arrangements for him to see the pulmonologist because of the strong family history of lung cancer in his mother and brother.  He does have pulmonary  nodules and an appointment was made a year ago but the patient did not keep that appointment. - Ambulatory referral to Pulmonology  6. Pulmonary nodules -Appointment with pulmonology and patient agrees to go - Ambulatory referral to Pulmonology  7. Need for immunization against influenza -Patient needs his adult pneumonia shot and this will be given today.  He is already had a Prevnar vaccine. -He will check with his insurance regarding the Shingrix vaccine for shingles.  Meds ordered this encounter  Medications  . venlafaxine XR (EFFEXOR-XR) 75 MG 24 hr capsule    Sig: Take 1 capsule (75 mg total) by mouth daily with breakfast.    Dispense:  90 capsule    Refill:  3    Generic For:*EFFEXOR XR  75MG    N O T I C E    Last quantity doesn't match original quantity  . rosuvastatin (CRESTOR) 10 MG tablet    Sig: Take 1 tablet (10 mg total) by mouth daily.    Dispense:  90 tablet    Refill:  3   Patient Instructions                       Medicare Annual Wellness  Visit  [facility] and the medical providers at [facility] strive to bring you the best medical care.  In doing so we not only want to address your current medical conditions and concerns but also to detect new conditions early and prevent illness, disease and health-related problems.    Medicare offers a yearly Wellness Visit which allows our clinical staff to assess your need for preventative services including immunizations, lifestyle education, counseling to decrease risk of preventable diseases and screening for fall risk and other medical concerns.    This visit is provided free of charge (no copay) for all Medicare recipients. The clinical pharmacists at [facility] have begun to conduct these Wellness Visits which will also include a thorough review of all your medications.    As you primary medical provider recommend that you make an appointment for your Annual Wellness Visit if you have not done so already this year.  You may set up this appointment before you leave today or you may call back ([phone]) and schedule an appointment.  Please make sure when you call that you mention that you are scheduling your Annual Wellness Visit with the clinical pharmacist so that the appointment may be made for the proper length of time.     Continue current medications. Continue good therapeutic lifestyle changes which include good diet and exercise. Fall precautions discussed with patient. If an FOBT was given today- please return it to our front desk. If you are over 3 years old - you may need Prevnar 68 or the adult Pneumonia vaccine.  **Flu shots are available--- please call and schedule a FLU-CLINIC appointment**  After your visit with [place] today you will receive a survey in the mail or online from [employer] regarding your care with [place]. Please take a moment to fill this out. Your feedback is very important to [place] as you can help [place] better understand  your patient needs as well as improve your experience and satisfaction. WE CARE ABOUT YOU!!!   Please schedule visit with ophthalmologist when that time comes up Please call Dr. Hilarie Fredrickson and make arrangements to get the repeat colonoscopy and tell him that you are having more trouble swallowing so that he can possibly do an endoscopy  at the same time We will also be making you an appointment with a pulmonologist because of a family history of lung cancer and because of the fact that you have 2 pulmonary nodules We will discuss with the radiologist about repeating your CT scan of the chest and if that has not been done by the time you see the pulmonologist he may have to get that particular repeat done It is important for you to schedule your ophthalmology visit at the appropriate time Start taking Crestor 10 mg at bedtime Continue to follow aggressive therapeutic lifestyle changes including diet exercise You will need to have a lipid liver panel done in about 6 weeks after starting the Crestor to make sure that it has no adverse effect on your liver function test If you develop any muscle aches or pains from your baseline you should stop the medicine and call us Reduce the vitamin D and take 5000 units Monday through Friday only   Arrie Senate MD

## 2017-12-01 DIAGNOSIS — D234 Other benign neoplasm of skin of scalp and neck: Secondary | ICD-10-CM | POA: Diagnosis not present

## 2017-12-01 DIAGNOSIS — C4441 Basal cell carcinoma of skin of scalp and neck: Secondary | ICD-10-CM | POA: Diagnosis not present

## 2017-12-15 DIAGNOSIS — C4441 Basal cell carcinoma of skin of scalp and neck: Secondary | ICD-10-CM | POA: Diagnosis not present

## 2017-12-24 ENCOUNTER — Encounter: Payer: Self-pay | Admitting: Family Medicine

## 2017-12-24 ENCOUNTER — Ambulatory Visit (INDEPENDENT_AMBULATORY_CARE_PROVIDER_SITE_OTHER): Payer: Medicare HMO | Admitting: Family Medicine

## 2017-12-24 VITALS — BP 127/71 | HR 61 | Temp 97.2°F | Ht 73.0 in | Wt 216.0 lb

## 2017-12-24 DIAGNOSIS — Z0000 Encounter for general adult medical examination without abnormal findings: Secondary | ICD-10-CM

## 2017-12-24 NOTE — Progress Notes (Signed)
Subjective:   Spencer White is a 67 y.o. male who presents for a Welcome to Medicare exam.   Cardiac Risk Factors include: advanced age (>34men, >47 women)     Objective:    Today's Vitals   12/24/17 1512  Weight: 216 lb (98 kg)  Height: 6\' 1"  (1.854 m)   Body mass index is 28.5 kg/m.  Medications Outpatient Encounter Medications as of 12/24/2017  Medication Sig  . rosuvastatin (CRESTOR) 10 MG tablet Take 1 tablet (10 mg total) by mouth daily.  Marland Kitchen venlafaxine XR (EFFEXOR-XR) 75 MG 24 hr capsule Take 1 capsule (75 mg total) by mouth daily with breakfast.   No facility-administered encounter medications on file as of 12/24/2017.      History: Past Medical History:  Diagnosis Date  . Allergy    seasonal allergies  . Asthma    as a child  . BCC (basal cell carcinoma), scalp/neck   . Depression   . Hyperlipidemia   . Prostate cancer ([place]) 2011  . Status post dilation of esophageal narrowing    Past Surgical History:  Procedure Laterality Date  . COLONOSCOPY    . PROSTATECTOMY  07/2009  . TONSILLECTOMY      Family History  Problem Relation Age of Onset  . Lung cancer Mother   . Lung cancer Brother   . Heart disease Father 46       Angioplasty stent  . Hypertension Father   . AAA (abdominal aortic aneurysm) Father   . Cancer Maternal Grandmother        bladder  . Asthma Paternal Grandmother   . Colon cancer Neg Hx   . Colon polyps Neg Hx   . Kidney disease Neg Hx   . Gallbladder disease Neg Hx   . Esophageal cancer Neg Hx   . Diabetes Neg Hx   . Stomach cancer Neg Hx   . Rectal cancer Neg Hx    Social History   Occupational History  . Occupation: Environmental manager  Tobacco Use  . Smoking status: Never Smoker  . Smokeless tobacco: Never Used  Substance and Sexual Activity  . Alcohol use: Yes    Alcohol/week: 0.0 oz    Comment: 1 glass red wine 1-2 a week  . Drug use: No  . Sexual activity: Not on file   Tobacco Counseling Counseling  given: Not Answered   Immunizations and Health Maintenance Immunization History  Administered Date(s) Administered  . Influenza,inj,Quad PF,6+ Mos 04/24/2013, 07/13/2014, 05/17/2015, 06/26/2016, 05/06/2017  . Pneumococcal Conjugate-13 05/17/2015  . Pneumococcal Polysaccharide-23 11/29/2017  . Tdap 12/14/2014  . Zoster 06/17/2015   Health Maintenance Due  Topic Date Due  . COLONOSCOPY  10/23/2017    Activities of Daily Living In your present state of health, do you have any difficulty performing the following activities: 12/24/2017  Hearing? N  Vision? N  Difficulty concentrating or making decisions? N  Walking or climbing stairs? N  Dressing or bathing? N  Doing errands, shopping? N  Preparing Food and eating ? N  Using the Toilet? N  In the past six months, have you accidently leaked urine? N  Do you have problems with loss of bowel control? N  Managing your Medications? N  Managing your Finances? N  Housekeeping or managing your Housekeeping? N  Some recent data might be hidden    Physical Exam (optional), or other factors deemed appropriate based on the beneficiary's medical and social history and current clinical standards.  Advanced  Directives: Does Patient Have a Medical Advance Directive?: Yes Type of Advance Directive: Healthcare Power of Attorney, Living will [place] in Chart?: No - copy requested    Assessment:    This is a routine wellness  examination for this patient .   Vision/Hearing screen No exam data present  Dietary issues and exercise activities discussed:  Current Exercise Habits: Home exercise routine, Type of exercise: strength training/weights, Time (Minutes): > 60, Frequency (Times/Week): 4, Weekly Exercise (Minutes/Week): 0, Intensity: Mild, Exercise limited by: None identified  Goals    None      Depression Screen PHQ 2/9 Scores 12/24/2017 11/29/2017 09/21/2016 01/21/2016  PHQ - 2 Score 0 0 1 0     Fall  Risk Fall Risk  12/24/2017  Falls in the past year? No    Cognitive Function MMSE - Mini Mental State Exam 12/24/2017  Orientation to time 5  Orientation to Place 5  Registration 3  Attention/ Calculation 5  Recall 3  Language- name 2 objects 2  Language- repeat 1  Language- follow 3 step command 3  Language- read & follow direction 1  Write a sentence 1  Copy design 1  Total score 30        Patient Care Team: Chipper Herb, MD as PCP - General (Family Medicine)     Plan:   keep follow up with Dr Laurance Flatten and other specialist.  I have personally reviewed and noted the following in the patient's chart:   . Medical and social history . Use of alcohol, tobacco or illicit drugs  . Current medications and supplements . Functional ability and status . Nutritional status . Physical activity . Advanced directives . List of other physicians . Hospitalizations, surgeries, and ER visits in previous 12 months . Vitals . Screenings to include cognitive, depression, and falls . Referrals and appointments  In addition, I have reviewed and discussed with patient certain preventive protocols, quality metrics, and best practice recommendations. A written personalized care plan for preventive services as well as general preventive health recommendations were provided to patient.  I personally encouraged the patient to keep his appointment with the pulmonologist because of his strong family history of lung cancer in his mother and his brother. Also he is reminded that he is to follow-up on precancerous polyps with a gastroenterologist and is to call and schedule a visit for repeat colonoscopy     Ronella Plunk, Cameron Proud, LPN 08/22/4973  I have reviewed and agree with the above AWV documentation.   Arrie Senate MD

## 2017-12-24 NOTE — Patient Instructions (Signed)
Patient is encouraged to follow-up on getting his colonoscopy repeated and keeping his appointment with the pulmonologist do a good respiratory evaluation in light of the history of lung cancer in his mother and brother

## 2017-12-29 DIAGNOSIS — Z85828 Personal history of other malignant neoplasm of skin: Secondary | ICD-10-CM | POA: Diagnosis not present

## 2017-12-29 DIAGNOSIS — Z08 Encounter for follow-up examination after completed treatment for malignant neoplasm: Secondary | ICD-10-CM | POA: Diagnosis not present

## 2017-12-29 DIAGNOSIS — L820 Inflamed seborrheic keratosis: Secondary | ICD-10-CM | POA: Diagnosis not present

## 2017-12-29 DIAGNOSIS — L905 Scar conditions and fibrosis of skin: Secondary | ICD-10-CM | POA: Diagnosis not present

## 2018-01-06 DIAGNOSIS — D485 Neoplasm of uncertain behavior of skin: Secondary | ICD-10-CM | POA: Diagnosis not present

## 2018-01-06 DIAGNOSIS — L989 Disorder of the skin and subcutaneous tissue, unspecified: Secondary | ICD-10-CM | POA: Diagnosis not present

## 2018-01-17 ENCOUNTER — Encounter: Payer: Self-pay | Admitting: Internal Medicine

## 2018-01-17 ENCOUNTER — Other Ambulatory Visit: Payer: Medicare HMO

## 2018-01-17 ENCOUNTER — Ambulatory Visit: Payer: Medicare HMO | Admitting: Internal Medicine

## 2018-01-17 VITALS — BP 122/82 | HR 77 | Ht 73.0 in | Wt 217.0 lb

## 2018-01-17 DIAGNOSIS — R918 Other nonspecific abnormal finding of lung field: Secondary | ICD-10-CM

## 2018-01-17 DIAGNOSIS — C61 Malignant neoplasm of prostate: Secondary | ICD-10-CM

## 2018-01-17 NOTE — Assessment & Plan Note (Signed)
Best surveillance is serial psa's, not chest ct, so no indication for chest ct on this basis as long as psa's remain undetectable s/p prostatectomy, which he assures me has been the case to date.

## 2018-01-17 NOTE — Patient Instructions (Addendum)
Please remember to go to the lab department downstairs in the basement  for your test - we will call you with the results when they are available.   I do not recommend any routine screening CT chest scans in this setting - pulmonary follow up is as needed

## 2018-01-17 NOTE — Assessment & Plan Note (Signed)
See Chest CT 09/10/14  X 4 mm,   CT [facility]  10/27/16 c/w MPNS but none > 4 mm  - Quant TB 01/17/2018    CT results reviewed with pt >>> Too small for PET or bx, not suspicious enough for excisional bx > really only option for now is follow the Fleischner society guidelines as rec by radiology>  Based passiive smoke exp > 15 years ago  and no nodules over 4 mm > no directed f/u though I did rec check Quant TB for baseline as he is at risk of reactivation TB based on possible prior Pos TB skin test and though would not treat this prophylactically it would be helpful to know if he's really at risk or not.   Pulmonary f/u can be prn    Total time devoted to counseling  > 50 % of initial 60 min office visit:  review case with pt/ discussion of options/alternatives/ personally creating written customized instructions  in presence of pt  then going over those specific  Instructions directly with the pt including how to use all of the meds but in particular covering each new medication in detail and the difference between the maintenance= "automatic" meds and the prns using an action plan format for the latter (If this problem/symptom => do that organization reading Left to right).  Please see AVS from this visit for a full list of these instructions which I personally wrote for this pt and  are unique to this visit.

## 2018-01-17 NOTE — Progress Notes (Signed)
Spencer White, male    DOB: 18-Apr-1951, 67 y.o.   MRN: [id number]   Brief patient profile:  66 yowm ? Asthma as very young child (no memory of it)  never smokier  Passive exp at home until age 34 and somewhat in workplace x late 1970s and no symptoms at all but concern was fm hx/ passive exp / prostate ca s/p prostatectomy with neg psa's since surgery  > ct chest  08/31/14 x 4 mm nodule   so referred to pulmonary clinic 01/17/2018 by Dr   Morrie Sheldon.    Reports possible  Pos "tine test"  June 1986     01/17/2018  1st pulmonary eval / Melvyn Novas re pulmonary nodules Chief Complaint  Patient presents with  . Pulmonary Consult    Referred by Dr. Laurance Flatten for eval of pulmonary nodules.  Pt denies any respiratory co's.  Dyspnea:  Works out at gym s limiting sob  Cough: none Sleep: flat  No arthritis/ rheumatoid symptoms    No obvious day to day or daytime variability or assoc excess/ purulent sputum or mucus plugs or hemoptysis or cp or chest tightness, subjective wheeze or overt sinus or hb sympto     Also denies any obvious fluctuation of symptoms with weather or environmental changes or other aggravating or alleviating factors except as outlined above   No unusual exposure hx or h/o childhood pna/   or knowledge of premature birth.  Current Allergies, Complete Past Medical History, Past Surgical History, Family History, and Social History were reviewed in [employer] record.  ROS  The following are not active complaints unless bolded Hoarseness, sore throat, dysphagia, dental problems, itching, sneezing,  nasal congestion or discharge of excess mucus or purulent secretions, ear ache,   fever, chills, sweats, unintended wt loss or wt gain, classically pleuritic or exertional cp,  orthopnea pnd or arm/hand swelling  or leg swelling, presyncope, palpitations, abdominal pain, anorexia, nausea, vomiting, diarrhea  or change in bowel habits or change in bladder habits, change in  stools or change in urine, dysuria, hematuria,  rash, arthralgias, visual complaints, headache, numbness, weakness or ataxia or problems with walking or coordination,  change in mood or  memory.               Past Medical History:  Diagnosis Date  . Allergy    seasonal allergies  . Asthma    as a child  . BCC (basal cell carcinoma), scalp/neck   . Depression   . Hyperlipidemia   . Prostate cancer ([place]) 2011  . Status post dilation of esophageal narrowing     Outpatient Medications Prior to Visit  Medication Sig Dispense Refill  . venlafaxine XR (EFFEXOR-XR) 75 MG 24 hr capsule Take 1 capsule (75 mg total) by mouth daily with breakfast. 90 capsule 3  . rosuvastatin (CRESTOR) 10 MG tablet Take 1 tablet (10 mg total) by mouth daily. (Patient not taking: Reported on 01/17/2018) 90 tablet 3   No facility-administered medications prior to visit.             Objective:     BP 122/82 (BP Location: Left Arm, Cuff Size: Normal)   Pulse 77   Ht 6\' 1"  (1.854 m)   Wt 217 lb (98.4 kg)   SpO2 98%   BMI 28.63 kg/m   SpO2: 98 % RA  Pleasant healthy appearing wm nad   Wt Readings from Last 3 Encounters:  01/17/18 217 lb (98.4 kg)  12/24/17 216 lb (98 kg)  11/29/17 214 lb (97.1 kg)      HEENT: nl dentition, turbinates bilaterally, and oropharynx. Nl external ear canals without cough reflex   NECK :  without JVD/Nodes/TM/ nl carotid upstrokes bilaterally   LUNGS: no acc muscle use,  Nl contour chest which is clear to A and P bilaterally without cough on insp or exp maneuvers   CV:  RRR  no s3 or murmur or increase in P2, and no edema   ABD:  soft and nontender with nl inspiratory excursion in the supine position. No bruits or organomegaly appreciated, bowel sounds nl  MS:  Nl gait/ ext warm without deformities, calf tenderness, cyanosis or clubbing No obvious joint restrictions   SKIN: warm and dry without lesions    NEURO:  alert, approp, nl sensorium with  no  motor or cerebellar deficits apparent.         Assessment   No problem-specific Assessment & Plan notes found for this encounter.     Christinia Gully, MD 01/17/2018

## 2018-01-19 LAB — QUANTIFERON-TB GOLD PLUS
Mitogen-NIL: >10 IU/mL
NIL: 0.03 IU/mL
QuantiFERON-TB Gold Plus: NEGATIVE
TB1-NIL: 0 [IU]/mL
TB2-NIL: 0 IU/mL

## 2018-01-19 NOTE — Progress Notes (Signed)
Spoke with pt and notified of results per Dr. Wert. Pt verbalized understanding and denied any questions. 

## 2018-01-26 DIAGNOSIS — C4441 Basal cell carcinoma of skin of scalp and neck: Secondary | ICD-10-CM | POA: Diagnosis not present

## 2018-01-26 DIAGNOSIS — L820 Inflamed seborrheic keratosis: Secondary | ICD-10-CM | POA: Diagnosis not present

## 2018-03-03 DIAGNOSIS — R69 Illness, unspecified: Secondary | ICD-10-CM | POA: Diagnosis not present

## 2018-03-08 DIAGNOSIS — R69 Illness, unspecified: Secondary | ICD-10-CM | POA: Diagnosis not present

## 2018-03-24 ENCOUNTER — Encounter: Payer: Self-pay | Admitting: *Deleted

## 2018-03-25 DIAGNOSIS — H5203 Hypermetropia, bilateral: Secondary | ICD-10-CM | POA: Diagnosis not present

## 2018-03-25 DIAGNOSIS — H0234 Blepharochalasis left upper eyelid: Secondary | ICD-10-CM | POA: Diagnosis not present

## 2018-03-25 DIAGNOSIS — H0231 Blepharochalasis right upper eyelid: Secondary | ICD-10-CM | POA: Diagnosis not present

## 2018-03-25 DIAGNOSIS — H25813 Combined forms of age-related cataract, bilateral: Secondary | ICD-10-CM | POA: Diagnosis not present

## 2018-03-25 DIAGNOSIS — H524 Presbyopia: Secondary | ICD-10-CM | POA: Diagnosis not present

## 2018-03-25 DIAGNOSIS — H52223 Regular astigmatism, bilateral: Secondary | ICD-10-CM | POA: Diagnosis not present

## 2018-04-05 ENCOUNTER — Ambulatory Visit: Payer: Medicare HMO | Admitting: Internal Medicine

## 2018-04-05 ENCOUNTER — Encounter: Payer: Self-pay | Admitting: Internal Medicine

## 2018-04-05 VITALS — BP 128/78 | HR 76 | Ht 73.0 in | Wt 217.0 lb

## 2018-04-05 DIAGNOSIS — K449 Diaphragmatic hernia without obstruction or gangrene: Secondary | ICD-10-CM | POA: Diagnosis not present

## 2018-04-05 DIAGNOSIS — K222 Esophageal obstruction: Secondary | ICD-10-CM | POA: Diagnosis not present

## 2018-04-05 DIAGNOSIS — Z86010 Personal history of colonic polyps: Secondary | ICD-10-CM | POA: Diagnosis not present

## 2018-04-05 DIAGNOSIS — R1310 Dysphagia, unspecified: Secondary | ICD-10-CM

## 2018-04-05 DIAGNOSIS — R1319 Other dysphagia: Secondary | ICD-10-CM

## 2018-04-05 MED ORDER — SUPREP BOWEL PREP KIT 17.5-3.13-1.6 GM/177ML PO SOLN: 1.0000 | ORAL | 0 refills | Status: DC

## 2018-04-05 NOTE — Progress Notes (Signed)
Patient ID: Spencer White, male   DOB: 1951/05/10, 67 y.o.   MRN: [id number] HPI: Spencer White is a 67 year old male with a past medical history of multiple adenomatous colon polyps, small hiatal hernia with Schatzki's ring, prostate cancer status post prostatectomy, hypertension and hyperlipidemia who is here to discuss dysphagia and polyp surveillance.  He is here alone today.  He had an upper endoscopy and colonoscopy performed on 10/24/2014.  Upper endoscopy showed a small hiatal hernia and Schatzki's ring dilated to 15 mm with balloon.  There was mild antral gastropathy which was biopsied and benign without H. pylori.  Colonoscopy revealed 8 polyps, 7 of which were tubular adenoma and one was a benign lymphoid polyp.  Mild diverticulosis was noted in the sigmoid colon.  He reports that he has been doing well.  After dilation more than 3 years ago he had improvement in his overall swallowing.  He still notices occasionally if he eats quickly or takes too big a bite he will have trouble with dry meats and foods such as bread.  Swallowing is better than it was before dilation more than 3 years ago.  He has less of a problem if he eats slowly, chews better and drinks liquid before he starts eating.  No other abdominal complaint including no abdominal pain, change in bowel habit, blood in stool or melena.  Bowel movements are regular without diarrhea or constipation.  Past Medical History:  Diagnosis Date  . Allergy    seasonal allergies  . Asthma    as a child  . BCC (basal cell carcinoma), scalp/neck   . Depression   . Diverticulosis   . Duodenitis   . Hiatal hernia   . Hyperlipidemia   . Prostate cancer ([facility]) 2011  . Schatzki's ring   . Status post dilation of esophageal narrowing   . Tubular adenoma of colon     Past Surgical History:  Procedure Laterality Date  . COLONOSCOPY    . PROSTATECTOMY  07/2009  . TONSILLECTOMY      Outpatient Medications Prior to Visit  Medication Sig  Dispense Refill  . rosuvastatin (CRESTOR) 10 MG tablet Take 1 tablet (10 mg total) by mouth daily. 90 tablet 3  . venlafaxine XR (EFFEXOR-XR) 75 MG 24 hr capsule Take 1 capsule (75 mg total) by mouth daily with breakfast. 90 capsule 3   No facility-administered medications prior to visit.     Allergies  Allergen Reactions  . Latex Other (See Comments)    Gets redness from band-aids    Family History  Problem Relation Age of Onset  . Lung cancer Mother   . Lung cancer Brother   . Heart disease Father 58       Angioplasty stent  . Hypertension Father   . AAA (abdominal aortic aneurysm) Father   . Cancer Maternal Grandmother        bladder  . Asthma Paternal Grandmother   . Colon cancer Neg Hx   . Colon polyps Neg Hx   . Kidney disease Neg Hx   . Gallbladder disease Neg Hx   . Esophageal cancer Neg Hx   . Diabetes Neg Hx   . Stomach cancer Neg Hx   . Rectal cancer Neg Hx     Social History   Tobacco Use  . Smoking status: Never Smoker  . Smokeless tobacco: Never Used  Substance Use Topics  . Alcohol use: Yes    Alcohol/week: 0.0 standard drinks    Comment: 1  glass red wine 1-2 a week  . Drug use: No    ROS: As per history of present illness, otherwise negative  BP 128/78   Pulse 76   Ht 6\' 1"  (1.854 m)   Wt 217 lb (98.4 kg)   BMI 28.63 kg/m  Constitutional: Well-developed and well-nourished. No distress. HEENT: Normocephalic and atraumatic. Oropharynx is clear and moist. Conjunctivae are normal.  No scleral icterus. Neck: Neck supple. Trachea midline. Cardiovascular: Normal rate, regular rhythm and intact distal pulses. No M/R/G Pulmonary/chest: Effort normal and breath sounds normal. No wheezing, rales or rhonchi. Abdominal: Soft, nontender, nondistended. Bowel sounds active throughout. There are no masses palpable. No hepatosplenomegaly. Extremities: no clubbing, cyanosis, or edema Neurological: Alert and oriented to person place and time. Skin: Skin is  warm and dry.  Psychiatric: Normal mood and affect. Behavior is normal.  RELEVANT LABS AND IMAGING: CBC    Component Value Date/Time   WBC 4.5 11/25/2017 1001   WBC 9.8 12/14/2014 0931   RBC 4.55 11/25/2017 1001   RBC 4.23 12/14/2014 0931   HGB 14.7 11/25/2017 1001   HCT 44.0 11/25/2017 1001   PLT 281 11/25/2017 1001   MCV 97 11/25/2017 1001   MCH 32.3 11/25/2017 1001   MCH 33.1 12/14/2014 0931   MCHC 33.4 11/25/2017 1001   MCHC 33.7 12/14/2014 0931   RDW 13.7 11/25/2017 1001   LYMPHSABS 1.3 11/25/2017 1001   MONOABS 0.7 12/14/2014 0931   EOSABS 0.2 11/25/2017 1001   BASOSABS 0.0 11/25/2017 1001    CMP     Component Value Date/Time   NA 138 11/25/2017 1001   K 4.7 11/25/2017 1001   CL 102 11/25/2017 1001   CO2 23 11/25/2017 1001   GLUCOSE 91 11/25/2017 1001   GLUCOSE 104 (H) 12/14/2014 0931   BUN 20 11/25/2017 1001   CREATININE 1.02 11/25/2017 1001   CALCIUM 9.6 11/25/2017 1001   PROT 6.3 11/25/2017 1001   ALBUMIN 4.3 11/25/2017 1001   AST 26 11/25/2017 1001   ALT 23 11/25/2017 1001   ALKPHOS 55 11/25/2017 1001   BILITOT 0.9 11/25/2017 1001   GFRNONAA 77 11/25/2017 1001   GFRAA 89 11/25/2017 1001    ASSESSMENT/PLAN: 67 year old male with a past medical history of multiple adenomatous colon polyps, small hiatal hernia with Schatzki's ring, prostate cancer status post prostatectomy, hypertension and hyperlipidemia who is here to discuss dysphagia and polyp surveillance.  1.  Dysphagia/history of Schatzki's ring --mild dysphagia with certain foods though better than prior to dilation in 2016.  Given some persistent dysphagia we will repeat upper endoscopy and consider repeat dilation based on findings.  We discussed the risk, benefits and alternatives and he is agreeable and wishes to proceed.  He does not have GERD findings such as heartburn or indigestion and therefore I am not adding GERD therapy.  2.  Colon polyps --7 adenomatous colon polyps 3 years ago.  He is  due for surveillance colonoscopy this year.  We discussed the risk, benefits and alternatives and he is agreeable and wishes to proceed.    VQ:MGQQP, Estella Husk, [place] [place], [place] [address]

## 2018-04-05 NOTE — Patient Instructions (Signed)
You have been scheduled for an endoscopy and colonoscopy. Please follow the written instructions given to you at your visit today. Please pick up your prep supplies at the pharmacy within the next 1-3 days. If you use inhalers (even only as needed), please bring them with you on the day of your procedure. Your physician has requested that you go to www.startemmi.com and enter the access code given to you at your visit today. This web site gives a general overview about your procedure. However, you should still follow specific instructions given to you by our office regarding your preparation for the procedure.  If you are age 96 or older, your body mass index should be between 23-30. Your Body mass index is 28.63 kg/m. If this is out of the aforementioned range listed, please consider follow up with your Primary Care Provider.  If you are age 62 or younger, your body mass index should be between 19-25. Your Body mass index is 28.63 kg/m. If this is out of the aformentioned range listed, please consider follow up with your Primary Care Provider.

## 2018-04-25 ENCOUNTER — Telehealth: Payer: Self-pay | Admitting: Internal Medicine

## 2018-04-26 NOTE — Telephone Encounter (Signed)
Left voicemail for patient to call back. 

## 2018-04-27 NOTE — Telephone Encounter (Signed)
Coupon to pay no more than $50 given to patient's pharmacist at [facility]. Coupon was accepted and Suprep to cost patient only $50 now.

## 2018-05-13 ENCOUNTER — Encounter: Payer: Self-pay | Admitting: Internal Medicine

## 2018-05-18 DIAGNOSIS — D225 Melanocytic nevi of trunk: Secondary | ICD-10-CM | POA: Diagnosis not present

## 2018-05-18 DIAGNOSIS — Z1283 Encounter for screening for malignant neoplasm of skin: Secondary | ICD-10-CM | POA: Diagnosis not present

## 2018-05-18 DIAGNOSIS — Z08 Encounter for follow-up examination after completed treatment for malignant neoplasm: Secondary | ICD-10-CM | POA: Diagnosis not present

## 2018-05-18 DIAGNOSIS — Z85828 Personal history of other malignant neoplasm of skin: Secondary | ICD-10-CM | POA: Diagnosis not present

## 2018-05-24 ENCOUNTER — Ambulatory Visit (AMBULATORY_SURGERY_CENTER): Payer: Medicare HMO | Admitting: Internal Medicine

## 2018-05-24 ENCOUNTER — Encounter: Payer: Self-pay | Admitting: Internal Medicine

## 2018-05-24 VITALS — BP 150/72 | HR 50 | Temp 97.3°F | Resp 13 | Ht 73.0 in | Wt 217.0 lb

## 2018-05-24 DIAGNOSIS — Z86010 Personal history of colonic polyps: Secondary | ICD-10-CM | POA: Diagnosis not present

## 2018-05-24 DIAGNOSIS — K449 Diaphragmatic hernia without obstruction or gangrene: Secondary | ICD-10-CM

## 2018-05-24 DIAGNOSIS — K222 Esophageal obstruction: Secondary | ICD-10-CM | POA: Diagnosis not present

## 2018-05-24 DIAGNOSIS — R1310 Dysphagia, unspecified: Secondary | ICD-10-CM

## 2018-05-24 MED ORDER — SODIUM CHLORIDE 0.9 % IV SOLN: 500.0000 mL | Freq: Once | INTRAVENOUS | Status: DC

## 2018-05-24 NOTE — Progress Notes (Signed)
Report given to PACU, vss 

## 2018-05-24 NOTE — Op Note (Signed)
[facility] Patient Name: Spencer White Procedure Date: 05/24/2018 11:18 AM MRN: [id number] Endoscopist: Jerene Bears , MD Age: 67 Referring MD:  Date of Birth: 09-Nov-1950 Gender: Male Account #: [id number] Procedure:                Upper GI endoscopy Indications:              Dysphagia Medicines:                Monitored Anesthesia Care Procedure:                Pre-Anesthesia Assessment:                           - Prior to the procedure, a History and Physical                            was performed, and patient medications and                            allergies were reviewed. The patient's tolerance of                            previous anesthesia was also reviewed. The risks                            and benefits of the procedure and the sedation                            options and risks were discussed with the patient.                            All questions were answered, and informed consent                            was obtained. Prior Anticoagulants: The patient has                            taken no previous anticoagulant or antiplatelet                            agents. ASA Grade Assessment: II - A patient with                            mild systemic disease. After reviewing the risks                            and benefits, the patient was deemed in                            satisfactory condition to undergo the procedure.                           After obtaining informed consent, the endoscope was  passed under direct vision. Throughout the                            procedure, the patient's blood pressure, pulse, and                            oxygen saturations were monitored continuously. The                            Model GIF-HQ190 [id number]) scope was introduced                            through the mouth, and advanced to the second part                            of duodenum. The upper GI endoscopy was                        accomplished without difficulty. The patient                            tolerated the procedure well. Scope In: Scope Out: Findings:                 A low-grade of narrowing Schatzki ring was found at                            the gastroesophageal junction. There was mild                            mucosal disruption with passage of adult upper                            endoscope. A TTS dilator was passed through the                            scope. Dilation with an 05-10-12 mm balloon dilator                            was performed to 12 mm. The dilation site was                            examined and showed moderate mucosal disruption and                            moderate improvement in luminal narrowing.                           A 1 cm hiatal hernia was present.                           The entire examined stomach was normal.                           Localized mild inflammation was found  in the                            duodenal bulb.                           The second portion of the duodenum was normal. Complications:            No immediate complications. Estimated Blood Loss:     Estimated blood loss was minimal. Impression:               - Low-grade of narrowing Schatzki ring. Dilated to                            12 mm with balloon.                           - 1 cm hiatal hernia.                           - Normal stomach.                           - Mild duodenitis.                           - Normal second portion of the duodenum.                           - No specimens collected. Recommendation:           - Patient has a contact number available for                            emergencies. The signs and symptoms of potential                            delayed complications were discussed with the                            patient. Return to normal activities tomorrow.                            Written discharge instructions were provided to  the                            patient.                           - Resume previous diet.                           - Continue present medications.                           - Repeat upper endoscopy as needed for retreatment. Jerene Bears, MD 05/24/2018 12:00:42 PM This report has been signed electronically.

## 2018-05-24 NOTE — Op Note (Signed)
[facility] Patient Name: Spencer White Procedure Date: 05/24/2018 11:17 AM MRN: [id number] Endoscopist: Jerene Bears , MD Age: 67 Referring MD:  Date of Birth: Nov 02, 1950 Gender: Male Account #: [id number] Procedure:                Colonoscopy Indications:              Surveillance: Personal history of adenomatous                            polyps (7 less than 10 mm) on last colonoscopy 3                            years ago Medicines:                Monitored Anesthesia Care Procedure:                Pre-Anesthesia Assessment:                           - Prior to the procedure, a History and Physical                            was performed, and patient medications and                            allergies were reviewed. The patient's tolerance of                            previous anesthesia was also reviewed. The risks                            and benefits of the procedure and the sedation                            options and risks were discussed with the patient.                            All questions were answered, and informed consent                            was obtained. Prior Anticoagulants: The patient has                            taken no previous anticoagulant or antiplatelet                            agents. ASA Grade Assessment: II - A patient with                            mild systemic disease. After reviewing the risks                            and benefits, the patient was deemed in  satisfactory condition to undergo the procedure.                           After obtaining informed consent, the colonoscope                            was passed under direct vision. Throughout the                            procedure, the patient's blood pressure, pulse, and                            oxygen saturations were monitored continuously. The                            Colonoscope was introduced through the anus and                             advanced to the cecum, identified by appendiceal                            orifice and ileocecal valve. The colonoscopy was                            performed without difficulty. The patient tolerated                            the procedure well. The quality of the bowel                            preparation was good. The ileocecal valve,                            appendiceal orifice, and rectum were photographed. Scope In: 11:39:44 AM Scope Out: 11:55:36 AM Scope Withdrawal Time: 0 hours 12 minutes 6 seconds  Total Procedure Duration: 0 hours 15 minutes 52 seconds  Findings:                 The digital rectal exam was normal.                           Multiple small-mouthed diverticula were found in                            the sigmoid colon.                           Internal hemorrhoids were found during                            retroflexion. The hemorrhoids were small.                           The exam was otherwise without abnormality. Complications:            No immediate complications. Estimated  Blood Loss:     Estimated blood loss: none. Impression:               - Diverticulosis in the sigmoid colon.                           - Internal hemorrhoids.                           - The examination was otherwise normal.                           - No specimens collected. Recommendation:           - Patient has a contact number available for                            emergencies. The signs and symptoms of potential                            delayed complications were discussed with the                            patient. Return to normal activities tomorrow.                            Written discharge instructions were provided to the                            patient.                           - Resume previous diet.                           - Continue present medications.                           - Repeat colonoscopy in 5 years for  surveillance. Jerene Bears, MD 05/24/2018 12:02:53 PM This report has been signed electronically.

## 2018-05-24 NOTE — Patient Instructions (Signed)
YOU HAD AN ENDOSCOPIC PROCEDURE TODAY AT [facility]:   Refer to the procedure report that was given to you for any specific questions about what was found during the examination.  If the procedure report does not answer your questions, please call your gastroenterologist to clarify.  If you requested that your care partner not be given the details of your procedure findings, then the procedure report has been included in a sealed envelope for you to review at your convenience later.  YOU SHOULD EXPECT: Some feelings of bloating in the abdomen. Passage of more gas than usual.  Walking can help get rid of the air that was put into your GI tract during the procedure and reduce the bloating. If you had a lower endoscopy (such as a colonoscopy or flexible sigmoidoscopy) you may notice spotting of blood in your stool or on the toilet paper. If you underwent a bowel prep for your procedure, you may not have a normal bowel movement for a few days.  Please Note:  You might notice some irritation and congestion in your nose or some drainage.  This is from the oxygen used during your procedure.  There is no need for concern and it should clear up in a day or so.  SYMPTOMS TO REPORT IMMEDIATELY:   Following lower endoscopy (colonoscopy or flexible sigmoidoscopy):  Excessive amounts of blood in the stool  Significant tenderness or worsening of abdominal pains  Swelling of the abdomen that is new, acute  Fever of 100F or higher   Following upper endoscopy (EGD)  Vomiting of blood or coffee ground material  New chest pain or pain under the shoulder blades  Painful or persistently difficult swallowing  New shortness of breath  Fever of 100F or higher  Black, tarry-looking stools  For urgent or emergent issues, a gastroenterologist can be reached at any hour by calling [phone].   DIET:  We do recommend a small meal at first, but then you may proceed to your regular diet.  Drink  plenty of fluids but you should avoid alcoholic beverages for 24 hours.  ACTIVITY:  You should plan to take it easy for the rest of today and you should NOT DRIVE or use heavy machinery until tomorrow (because of the sedation medicines used during the test).    FOLLOW UP: Our staff will call the number listed on your records the next business day following your procedure to check on you and address any questions or concerns that you may have regarding the information given to you following your procedure. If we do not reach you, we will leave a message.  However, if you are feeling well and you are not experiencing any problems, there is no need to return our call.  We will assume that you have returned to your regular daily activities without incident.  If any biopsies were taken you will be contacted by phone or by letter within the next 1-3 weeks.  Please call us at [phone] if you have not heard about the biopsies in 3 weeks.    SIGNATURES/CONFIDENTIALITY: You and/or your care partner have signed paperwork which will be entered into your electronic medical record.  These signatures attest to the fact that that the information above on your After Visit Summary has been reviewed and is understood.  Full responsibility of the confidentiality of this discharge information lies with you and/or your care-partner.  Hemorrhoid, post esophageal dilation diet, stricture and diverticulosis information given.

## 2018-05-25 ENCOUNTER — Telehealth: Payer: Self-pay

## 2018-05-25 ENCOUNTER — Telehealth: Payer: Self-pay | Admitting: *Deleted

## 2018-05-25 NOTE — Telephone Encounter (Signed)
  Follow up Call-  Call back number 05/24/2018  Post procedure Call Back phone  # [phone]  Permission to leave phone message Yes  Some recent data might be hidden     Patient questions:  Do you have a fever, pain , or abdominal swelling? No. Pain Score  0 *  Have you tolerated food without any problems? Yes.    Have you been able to return to your normal activities? Yes.    Do you have any questions about your discharge instructions: Diet   No. Medications  No. Follow up visit  No.  Do you have questions or concerns about your Care? No.  Actions: * If pain score is 4 or above: No action needed, pain <4.

## 2018-05-25 NOTE — Telephone Encounter (Signed)
Left message

## 2018-06-01 ENCOUNTER — Ambulatory Visit: Payer: Medicare HMO | Admitting: Family Medicine

## 2018-06-08 ENCOUNTER — Encounter: Payer: Self-pay | Admitting: Family Medicine

## 2018-07-07 ENCOUNTER — Ambulatory Visit (INDEPENDENT_AMBULATORY_CARE_PROVIDER_SITE_OTHER): Payer: Medicare HMO

## 2018-07-07 DIAGNOSIS — Z23 Encounter for immunization: Secondary | ICD-10-CM

## 2018-07-26 DIAGNOSIS — Z0100 Encounter for examination of eyes and vision without abnormal findings: Secondary | ICD-10-CM | POA: Diagnosis not present

## 2018-09-05 DIAGNOSIS — R69 Illness, unspecified: Secondary | ICD-10-CM | POA: Diagnosis not present

## 2018-10-26 ENCOUNTER — Telehealth: Payer: Self-pay | Admitting: Family Medicine

## 2018-11-30 ENCOUNTER — Other Ambulatory Visit: Payer: Self-pay | Admitting: *Deleted

## 2018-11-30 ENCOUNTER — Other Ambulatory Visit: Payer: Self-pay | Admitting: Family Medicine

## 2018-11-30 MED ORDER — VENLAFAXINE HCL ER 75 MG PO CP24: 75.0000 mg | ORAL_CAPSULE | Freq: Every day | ORAL | 0 refills | Status: DC

## 2018-11-30 NOTE — Telephone Encounter (Signed)
DW[facility] NTBS LOV 11/29/17

## 2018-12-15 DIAGNOSIS — R69 Illness, unspecified: Secondary | ICD-10-CM | POA: Diagnosis not present

## 2019-01-30 DIAGNOSIS — L308 Other specified dermatitis: Secondary | ICD-10-CM | POA: Diagnosis not present

## 2019-01-30 DIAGNOSIS — L821 Other seborrheic keratosis: Secondary | ICD-10-CM | POA: Diagnosis not present

## 2019-01-30 DIAGNOSIS — Z85828 Personal history of other malignant neoplasm of skin: Secondary | ICD-10-CM | POA: Diagnosis not present

## 2019-01-30 DIAGNOSIS — Z08 Encounter for follow-up examination after completed treatment for malignant neoplasm: Secondary | ICD-10-CM | POA: Diagnosis not present

## 2019-01-30 DIAGNOSIS — C4441 Basal cell carcinoma of skin of scalp and neck: Secondary | ICD-10-CM | POA: Diagnosis not present

## 2019-01-30 DIAGNOSIS — R69 Illness, unspecified: Secondary | ICD-10-CM | POA: Diagnosis not present

## 2019-02-23 ENCOUNTER — Telehealth: Payer: Self-pay | Admitting: *Deleted

## 2019-02-23 NOTE — Telephone Encounter (Signed)
Spoke with the pt and scheduled ov for 02/28/19 at 12 noon

## 2019-02-23 NOTE — Telephone Encounter (Signed)
-----   Message from Tanda Rockers, MD sent at 02/23/2019  3:47 PM EDT ----- Dr Laurance Flatten had req we see him this summer re mpns - see if willing to do f/u in September 2020 ov 1st and if not we can let Dr Laurance Flatten know we attempted to close the loop and leave it to him to order ----- Message ----- From: Tanda Rockers, MD Sent: 01/18/2018  11:52 AM EDT To: Tanda Rockers, MD  Needs f/u ov at request of Dr Laurance Flatten re mpns

## 2019-02-27 DIAGNOSIS — Z08 Encounter for follow-up examination after completed treatment for malignant neoplasm: Secondary | ICD-10-CM | POA: Diagnosis not present

## 2019-02-27 DIAGNOSIS — Z85828 Personal history of other malignant neoplasm of skin: Secondary | ICD-10-CM | POA: Diagnosis not present

## 2019-02-28 ENCOUNTER — Encounter: Payer: Self-pay | Admitting: Internal Medicine

## 2019-02-28 ENCOUNTER — Other Ambulatory Visit: Payer: Self-pay

## 2019-02-28 ENCOUNTER — Ambulatory Visit: Payer: Medicare HMO | Admitting: Internal Medicine

## 2019-02-28 DIAGNOSIS — R918 Other nonspecific abnormal finding of lung field: Secondary | ICD-10-CM

## 2019-02-28 NOTE — Progress Notes (Signed)
Spencer White, male    DOB: [birth year], 68 y.o.   MRN: [id number]   Brief patient profile:  67 yowm ? Asthma as very young child (no memory of it)  never smoker Passive exp at home until age 2 and somewhat in workplace x late 1970s and no symptoms at all but concern was fm hx/ passive exp / prostate ca s/p prostatectomy with neg psa's since surgery  > ct chest  08/31/14 x 4 mm nodule   so referred to pulmonary clinic 01/17/2018 by Dr   Morrie Sheldon.    Reports possible  Pos "tine test"  June 1986 >  Quant TB 01/17/2018  neg    01/17/2018  1st pulmonary eval / Spencer White re pulmonary nodules Chief Complaint  Patient presents with  . Pulmonary Consult    Referred by Dr. Laurance Flatten for eval of pulmonary nodules.  Pt denies any respiratory co's.  Dyspnea:  Works out at gym s limiting sob  Cough: none Sleep: flat  No arthritis/ rheumatoid symptoms  rec F/u ct optional      02/28/2019  f/u ov/Spencer White re: mpns, concerned as fm hx pos lung ca though only in smokers Chief Complaint  Patient presents with  . Follow-up    Doing well and no co's.   Dyspnea:  Not limited by breathing from desired activities  - does fine with yardwork  Cough: nne  Sleeping: ok flat SABA use: no 02: no  Prostate ca f/u has been favorable to date s met dz     No obvious day to day or daytime variability or assoc excess/ purulent sputum or mucus plugs or hemoptysis or cp or chest tightness, subjective wheeze or overt sinus or hb symptoms.   Sleeping  without nocturnal  or early am exacerbation  of respiratory  c/o's or need for noct saba. Also denies any obvious fluctuation of symptoms with weather or environmental changes or other aggravating or alleviating factors except as outlined above   No unusual exposure hx or h/o childhood pna/ asthma or knowledge of premature birth.  Current Allergies, Complete Past Medical History, Past Surgical History, Family History, and Social History were reviewed in [facility] record.  ROS  The following are not active complaints unless bolded Hoarseness, sore throat, dysphagia, dental problems, itching, sneezing,  nasal congestion or discharge of excess mucus or purulent secretions, ear ache,   fever, chills, sweats, unintended wt loss or wt gain, classically pleuritic or exertional cp,  orthopnea pnd or arm/hand swelling  or leg swelling, presyncope, palpitations, abdominal pain, anorexia, nausea, vomiting, diarrhea  or change in bowel habits or change in bladder habits, change in stools or change in urine, dysuria, hematuria,  rash, arthralgias, visual complaints, headache, numbness, weakness or ataxia or problems with walking or coordination,  change in mood or  memory.        Current Meds  Medication Sig  . venlafaxine XR (EFFEXOR-XR) 75 MG 24 hr capsule Take 1 capsule (75 mg total) by mouth daily with breakfast.                 Past Medical History:  Diagnosis Date  . Allergy    seasonal allergies  . Asthma    as a child  . BCC (basal cell carcinoma), scalp/neck   . Depression   . Hyperlipidemia   . Prostate cancer ([place]) 2011  . Status post dilation of esophageal narrowing  Objective:     amb wm nad   Vital signs reviewed - Note on arrival 02 sats  98% on RA    02/28/2019          223   01/17/18 217 lb (98.4 kg)  12/24/17 216 lb (98 kg)  11/29/17 214 lb (97.1 kg)      HEENT : pt wearing mask not removed for exam due to covid - 19 concerns.   NECK :  without JVD/Nodes/TM/ nl carotid upstrokes bilaterally   LUNGS: no acc muscle use,  Nl contour chest which is clear to A and P bilaterally without cough on insp or exp maneuvers   CV:  RRR  no s3 or murmur or increase in P2, and no edema   ABD:  soft and nontender with nl inspiratory excursion in the supine position. No bruits or organomegaly appreciated, bowel sounds nl  MS:  Nl gait/ ext warm without deformities, calf tenderness, cyanosis or  clubbing No obvious joint restrictions   SKIN: warm and dry without lesions    NEURO:  alert, approp, nl sensorium with  no motor or cerebellar deficits apparent.            Assessment

## 2019-02-28 NOTE — Assessment & Plan Note (Signed)
See Chest CT 09/10/14  X 4 mm,   CT [facility]  10/27/16 c/w MPNS but none > 4 mm  - Quant TB 01/17/2018  Neg - repeat CT s contrast  His PCP rec a final f/u based on fm hx and we don't actually have an apples to apples comparing prior two studies so reasonable to close the loop with final CT chest if insurance covers    Discussed in detail all the  indications, usual  risks and alternatives  relative to the benefits with patient who agrees to proceed with w/u as outlined.     > 50% of 25 min spent counseling re risk/beneift of CT f/u and Fleischner guidelines behind standard recs

## 2019-02-28 NOTE — Patient Instructions (Signed)
We will call you to arrange CT chest to follow up the lung nodules seen since 2016 - if not change then no further studies needed

## 2019-03-07 ENCOUNTER — Telehealth: Payer: Self-pay | Admitting: Family Medicine

## 2019-03-07 MED ORDER — VENLAFAXINE HCL ER 75 MG PO CP24: 75.0000 mg | ORAL_CAPSULE | Freq: Every day | ORAL | 0 refills | Status: DC

## 2019-03-07 NOTE — Telephone Encounter (Signed)
What is the name of the medication? venlafaxine XR (EFFEXOR-XR) 75 MG 24 hr capsule  Have you contacted your pharmacy to request a refill? no  Which pharmacy would you like this sent to? [facility] phone [phone] fax [phone]   Patient notified that their request is being sent to the clinical staff for review and that they should receive a call once it is complete. If they do not receive a call within 24 hours they can check with their pharmacy or our office.

## 2019-03-07 NOTE — Telephone Encounter (Signed)
Refilled - 6 mo appt made (DEC)

## 2019-03-25 DIAGNOSIS — R918 Other nonspecific abnormal finding of lung field: Secondary | ICD-10-CM

## 2019-03-27 NOTE — Telephone Encounter (Signed)
Per 9.1.2020 contul note with MW: Pulmonary nodules - Tanda Rockers, MD at 02/28/2019  1:14 PM Status: Written  Related Problem: Pulmonary nodules   See Chest CT 09/10/14  X 4 mm,   CT [facility]  10/27/16 c/w MPNS but none > 4 mm  - Quant TB 01/17/2018  Neg - repeat CT s contrast   His PCP rec a final f/u based on fm hx and we don't actually have an apples to apples comparing prior two studies so reasonable to close the loop with final CT chest if insurance covers      Discussed in detail all the  indications, usual  risks and alternatives  relative to the benefits with patient who agrees to proceed with w/u as outlined.      > 50% of 25 min spent counseling re risk/beneift of CT f/u and Fleischner guidelines behind standard recs        CT order was not placed at time of visit Order placed now for CT w/o contrast  E-mail sent to patient informing him

## 2019-04-07 ENCOUNTER — Ambulatory Visit (HOSPITAL_COMMUNITY)
Admission: RE | Admit: 2019-04-07 | Discharge: 2019-04-07 | Disposition: A | Discharge status: Home / Self Care | Payer: Medicare HMO | Source: Ambulatory Visit | Attending: Internal Medicine | Admitting: Internal Medicine

## 2019-04-07 ENCOUNTER — Other Ambulatory Visit: Payer: Self-pay

## 2019-04-07 DIAGNOSIS — J439 Emphysema, unspecified: Secondary | ICD-10-CM | POA: Diagnosis not present

## 2019-04-07 DIAGNOSIS — R918 Other nonspecific abnormal finding of lung field: Secondary | ICD-10-CM | POA: Diagnosis not present

## 2019-04-10 NOTE — Progress Notes (Signed)
Pt advised:Study is unchanged and radiographically benign so no f/u needed per guidelines/ pulmonary f/u is prn  Nothing further needed.

## 2019-05-05 ENCOUNTER — Telehealth: Payer: Self-pay | Admitting: General Practice

## 2019-05-05 NOTE — Telephone Encounter (Signed)
Appointment made

## 2019-05-15 ENCOUNTER — Other Ambulatory Visit: Payer: Self-pay

## 2019-05-16 ENCOUNTER — Other Ambulatory Visit: Payer: Self-pay

## 2019-05-16 ENCOUNTER — Ambulatory Visit (INDEPENDENT_AMBULATORY_CARE_PROVIDER_SITE_OTHER): Payer: Medicare HMO

## 2019-05-16 DIAGNOSIS — Z23 Encounter for immunization: Secondary | ICD-10-CM | POA: Diagnosis not present

## 2019-05-23 ENCOUNTER — Ambulatory Visit: Payer: Medicare HMO

## 2019-06-02 ENCOUNTER — Other Ambulatory Visit: Payer: Self-pay

## 2019-06-05 ENCOUNTER — Ambulatory Visit (INDEPENDENT_AMBULATORY_CARE_PROVIDER_SITE_OTHER): Payer: Medicare HMO | Admitting: Family Medicine

## 2019-06-05 ENCOUNTER — Encounter: Payer: Self-pay | Admitting: Family Medicine

## 2019-06-05 ENCOUNTER — Other Ambulatory Visit: Payer: Self-pay

## 2019-06-05 VITALS — BP 135/79 | HR 69 | Temp 96.6°F | Resp 20 | Ht 73.0 in | Wt 219.0 lb

## 2019-06-05 DIAGNOSIS — E785 Hyperlipidemia, unspecified: Secondary | ICD-10-CM

## 2019-06-05 DIAGNOSIS — Z6828 Body mass index (BMI) 28.0-28.9, adult: Secondary | ICD-10-CM | POA: Insufficient documentation

## 2019-06-05 DIAGNOSIS — R69 Illness, unspecified: Secondary | ICD-10-CM | POA: Diagnosis not present

## 2019-06-05 DIAGNOSIS — F339 Major depressive disorder, recurrent, unspecified: Secondary | ICD-10-CM | POA: Diagnosis not present

## 2019-06-05 DIAGNOSIS — Z125 Encounter for screening for malignant neoplasm of prostate: Secondary | ICD-10-CM

## 2019-06-05 DIAGNOSIS — I10 Essential (primary) hypertension: Secondary | ICD-10-CM | POA: Diagnosis not present

## 2019-06-05 DIAGNOSIS — F419 Anxiety disorder, unspecified: Secondary | ICD-10-CM

## 2019-06-05 NOTE — Patient Instructions (Signed)
Health Maintenance After Age 67 After age 67, you are at a higher risk for certain long-term diseases and infections as well as injuries from falls. Falls are a major cause of broken bones and head injuries in people who are older than age 67. Getting regular preventive care can help to keep you healthy and well. Preventive care includes getting regular testing and making lifestyle changes as recommended by your health care provider. Talk with your health care provider about:  Which screenings and tests you should have. A screening is a test that checks for a disease when you have no symptoms.  A diet and exercise plan that is right for you. What should I know about screenings and tests to prevent falls? Screening and testing are the best ways to find a health problem early. Early diagnosis and treatment give you the best chance of managing medical conditions that are common after age 67. Certain conditions and lifestyle choices may make you more likely to have a fall. Your health care provider may recommend:  Regular vision checks. Poor vision and conditions such as cataracts can make you more likely to have a fall. If you wear glasses, make sure to get your prescription updated if your vision changes.  Medicine review. Work with your health care provider to regularly review all of the medicines you are taking, including over-the-counter medicines. Ask your health care provider about any side effects that may make you more likely to have a fall. Tell your health care provider if any medicines that you take make you feel dizzy or sleepy.  Osteoporosis screening. Osteoporosis is a condition that causes the bones to get weaker. This can make the bones weak and cause them to break more easily.  Blood pressure screening. Blood pressure changes and medicines to control blood pressure can make you feel dizzy.  Strength and balance checks. Your health care provider may recommend certain tests to check your  strength and balance while standing, walking, or changing positions.  Foot health exam. Foot pain and numbness, as well as not wearing proper footwear, can make you more likely to have a fall.  Depression screening. You may be more likely to have a fall if you have a fear of falling, feel emotionally low, or feel unable to do activities that you used to do.  Alcohol use screening. Using too much alcohol can affect your balance and may make you more likely to have a fall. What actions can I take to lower my risk of falls? General instructions  Talk with your health care provider about your risks for falling. Tell your health care provider if: ? You fall. Be sure to tell your health care provider about all falls, even ones that seem minor. ? You feel dizzy, sleepy, or off-balance.  Take over-the-counter and prescription medicines only as told by your health care provider. These include any supplements.  Eat a healthy diet and maintain a healthy weight. A healthy diet includes low-fat dairy products, low-fat (lean) meats, and fiber from whole grains, beans, and lots of fruits and vegetables. Home safety  Remove any tripping hazards, such as rugs, cords, and clutter.  Install safety equipment such as grab bars in bathrooms and safety rails on stairs.  Keep rooms and walkways well-lit. Activity   Follow a regular exercise program to stay fit. This will help you maintain your balance. Ask your health care provider what types of exercise are appropriate for you.  If you need a cane or   walker, use it as recommended by your health care provider.  Wear supportive shoes that have nonskid soles. Lifestyle  Do not drink alcohol if your health care provider tells you not to drink.  If you drink alcohol, limit how much you have: ? 0-1 drink a day for women. ? 0-2 drinks a day for men.  Be aware of how much alcohol is in your drink. In the [place] one drink equals one typical bottle of beer (12  oz), one-half glass of wine (5 oz), or one shot of hard liquor (1 oz).  Do not use any products that contain nicotine or tobacco, such as cigarettes and e-cigarettes. If you need help quitting, ask your health care provider. Summary  Having a healthy lifestyle and getting preventive care can help to protect your health and wellness after age 67.  Screening and testing are the best way to find a health problem early and help you avoid having a fall. Early diagnosis and treatment give you the best chance for managing medical conditions that are more common for people who are older than age 67.  Falls are a major cause of broken bones and head injuries in people who are older than age 67. Take precautions to prevent a fall at home.  Work with your health care provider to learn what changes you can make to improve your health and wellness and to prevent falls. This information is not intended to replace advice given to you by your health care provider. Make sure you discuss any questions you have with your health care provider. Document Released: 04/28/2017 Document Revised: 10/06/2018 Document Reviewed: 04/28/2017 Elsevier Patient Education  2020 Elsevier Inc.  

## 2019-06-05 NOTE — Progress Notes (Signed)
Subjective:  Patient ID: Spencer White, male    DOB: [birth year], 68 y.o.   MRN: [id number]  Patient Care Team: Baruch Gouty, FNP as PCP - General (Family Medicine)   Chief Complaint:  Medical Management of Chronic Issues (yearly )   HPI: Spencer White is a 68 y.o. male presenting on 06/05/2019 for Medical Management of Chronic Issues (yearly )   Pt presents today for management of chronic medical conditions. Pt states he is doing well overall. States he has not started statin therapy. States he has started exercising again and trying to eat better. Pt states his depression and anxiety are very well controlled and he is considering tapering off of his medications. States he does not feel he needs them any longer. He has a history of prostate cancer and had a total prostatectomy. He was discharged from urology care in 2018. He denies LUTS. No weight changes or night sweats. No abdominal or rectal pain.    Relevant past medical, surgical, family, and social history reviewed and updated as indicated.  Allergies and medications reviewed and updated. Date reviewed: Chart in Epic.   Past Medical History:  Diagnosis Date  . Allergy    seasonal allergies  . Asthma    as a child  . BCC (basal cell carcinoma), scalp/neck   . Depression   . Diverticulosis   . Duodenitis   . Hiatal hernia   . Hyperlipidemia   . Prostate cancer ([place]) 2011  . Schatzki's ring   . Status post dilation of esophageal narrowing   . Tubular adenoma of colon     Past Surgical History:  Procedure Laterality Date  . COLONOSCOPY    . PROSTATECTOMY  07/2009  . TONSILLECTOMY      Social History   Socioeconomic History  . Marital status: Divorced    Spouse name: Not on file  . Number of children: 0  . Years of education: Not on file  . Highest education level: Not on file  Occupational History  . Occupation: Environmental manager  Social Needs  . Financial resource strain: Not on file  . Food  insecurity    Worry: Not on file    Inability: Not on file  . Transportation needs    Medical: Not on file    Non-medical: Not on file  Tobacco Use  . Smoking status: Never Smoker  . Smokeless tobacco: Never Used  Substance and Sexual Activity  . Alcohol use: Yes    Alcohol/week: 0.0 standard drinks    Comment: 1 glass red wine 1-2 a week  . Drug use: No  . Sexual activity: Not on file  Lifestyle  . Physical activity    Days per week: Not on file    Minutes per session: Not on file  . Stress: Not on file  Relationships  . Social Herbalist on phone: Not on file    Gets together: Not on file    Attends religious service: Not on file    Active member of club or organization: Not on file    Attends meetings of clubs or organizations: Not on file    Relationship status: Not on file  . Intimate partner violence    Fear of current or ex partner: Not on file    Emotionally abused: Not on file    Physically abused: Not on file    Forced sexual activity: Not on file  Other Topics Concern  .  Not on file  Social History Narrative   Lives alone    Outpatient Encounter Medications as of 06/05/2019  Medication Sig  . Multiple Vitamin (MULTIVITAMIN WITH MINERALS) TABS tablet Take 1 tablet by mouth daily.  Marland Kitchen venlafaxine XR (EFFEXOR-XR) 75 MG 24 hr capsule Take 1 capsule (75 mg total) by mouth daily with breakfast.  . [DISCONTINUED] rosuvastatin (CRESTOR) 10 MG tablet Take 1 tablet (10 mg total) by mouth daily. (Patient not taking: Reported on 02/28/2019)   No facility-administered encounter medications on file as of 06/05/2019.     Allergies  Allergen Reactions  . Latex Other (See Comments)    Gets redness from band-aids    Review of Systems  Constitutional: Negative for activity change, appetite change, chills, fever and unexpected weight change.  HENT: Negative.   Eyes: Negative.  Negative for photophobia and visual disturbance.  Respiratory: Negative for cough,  chest tightness and shortness of breath.   Cardiovascular: Negative for chest pain, palpitations and leg swelling.  Gastrointestinal: Negative for abdominal distention, abdominal pain, anal bleeding, blood in stool, constipation, diarrhea, nausea, rectal pain and vomiting.  Endocrine: Negative.   Genitourinary: Negative for decreased urine volume, difficulty urinating, discharge, dysuria, flank pain, frequency, hematuria, penile pain, penile swelling, scrotal swelling, testicular pain and urgency.  Musculoskeletal: Negative for arthralgias, back pain and myalgias.  Skin: Negative.   Allergic/Immunologic: Negative.   Neurological: Negative for dizziness, seizures, facial asymmetry, speech difficulty, weakness, light-headedness, numbness and headaches.  Hematological: Negative.   Psychiatric/Behavioral: Negative for agitation, behavioral problems, confusion, decreased concentration, dysphoric mood, hallucinations, self-injury, sleep disturbance and suicidal ideas. The patient is not nervous/anxious and is not hyperactive.   All other systems reviewed and are negative.       Objective:  BP 135/79   Pulse 69   Temp (!) 96.6 F (35.9 C)   Resp 20   Ht _0  (1.854 m)   Wt 219 lb (99.3 kg)   SpO2 100%   BMI 28.89 kg/m    Wt Readings from Last 3 Encounters:  06/05/19 219 lb (99.3 kg)  02/28/19 223 lb 12.8 oz (101.5 kg)  05/24/18 217 lb (98.4 kg)    Physical Exam Vitals signs and nursing note reviewed.  Constitutional:      General: He is not in acute distress.    Appearance: Normal appearance. He is well-developed, well-groomed and overweight. He is not ill-appearing, toxic-appearing or diaphoretic.  HENT:     Head: Normocephalic and atraumatic.     Jaw: There is normal jaw occlusion.     Right Ear: Hearing, tympanic membrane, ear canal and external ear normal.     Left Ear: Hearing, tympanic membrane, ear canal and external ear normal.     Nose: Nose normal.     Mouth/Throat:      Lips: Pink.     Mouth: Mucous membranes are moist.     Pharynx: Oropharynx is clear. Uvula midline.  Eyes:     General: Lids are normal.     Extraocular Movements: Extraocular movements intact.     Conjunctiva/sclera: Conjunctivae normal.     Pupils: Pupils are equal, round, and reactive to light.  Neck:     Musculoskeletal: Normal range of motion and neck supple.     Thyroid: No thyroid mass, thyromegaly or thyroid tenderness.     Vascular: No carotid bruit or JVD.     Trachea: Trachea and phonation normal.  Cardiovascular:     Rate and Rhythm: Normal rate and regular  rhythm.     Chest Wall: PMI is not displaced.     Pulses: Normal pulses.     Heart sounds: Normal heart sounds. No murmur. No friction rub. No gallop.   Pulmonary:     Effort: Pulmonary effort is normal. No respiratory distress.     Breath sounds: Normal breath sounds. No wheezing.  Abdominal:     General: Bowel sounds are normal. There is no distension or abdominal bruit.     Palpations: Abdomen is soft. There is no hepatomegaly, splenomegaly or mass.     Tenderness: There is no abdominal tenderness. There is no right CVA tenderness, left CVA tenderness, guarding or rebound.     Hernia: No hernia is present.  Musculoskeletal: Normal range of motion.     Right lower leg: No edema.     Left lower leg: No edema.  Lymphadenopathy:     Cervical: No cervical adenopathy.  Skin:    General: Skin is warm and dry.     Capillary Refill: Capillary refill takes less than 2 seconds.     Coloration: Skin is not cyanotic, jaundiced or pale.     Findings: No rash.  Neurological:     General: No focal deficit present.     Mental Status: He is alert and oriented to person, place, and time.     Cranial Nerves: Cranial nerves are intact. No cranial nerve deficit.     Sensory: Sensation is intact. No sensory deficit.     Motor: Motor function is intact. No weakness.     Coordination: Coordination is intact. Coordination  normal.     Gait: Gait is intact. Gait normal.     Deep Tendon Reflexes: Reflexes are normal and symmetric. Reflexes normal.  Psychiatric:        Attention and Perception: Attention and perception normal.        Mood and Affect: Mood and affect normal.        Speech: Speech normal.        Behavior: Behavior normal. Behavior is cooperative.        Thought Content: Thought content normal.        Cognition and Memory: Cognition and memory normal.        Judgment: Judgment normal.     Results for orders placed or performed in visit on 01/17/18  QuantiFERON-TB Gold Plus  Result Value Ref Range   QuantiFERON-TB Gold Plus NEGATIVE NEGATIVE   NIL 0.03 IU/mL   Mitogen-NIL >10.00 IU/mL   TB1-NIL 0.00 IU/mL   TB2-NIL 0.00 IU/mL       Pertinent labs & imaging results that were available during my care of the patient were reviewed by me and considered in my medical decision making.  Assessment & Plan:  Sedric was seen today for medical management of chronic issues.  Diagnoses and all orders for this visit:  Dyslipidemia Diet and exercise encourage. Labs pending. Can take Red Yeast Rice 2400 mg daily.  -     Lipid panel  Essential hypertension Well controlled with diet and exercise. DASH diet encouraged.  -     CMP14+EGFR -     CBC with Differential/Platelet -     Thyroid Panel With TSH  Anxiety Depression, recurrent ([place]) Doing very well. No changes.  -     Thyroid Panel With TSH  BMI 28.0-28.9,adult Diet and exercise encouraged. Labs pending.  -     CMP14+EGFR -     CBC with Differential/Platelet -  Lipid panel -     Thyroid Panel With TSH  Screening PSA (prostate specific antigen) -     PSA, total and free     Continue all other maintenance medications.  Follow up plan: Return if symptoms worsen or fail to improve.  Continue healthy lifestyle choices, including diet (rich in fruits, vegetables, and lean proteins, and low in salt and simple carbohydrates) and  exercise (at least 30 minutes of moderate physical activity daily).  Educational handout given for health maintenance  The above assessment and management plan was discussed with the patient. The patient verbalized understanding of and has agreed to the management plan. Patient is aware to call the clinic if they develop any new symptoms or if symptoms persist or worsen. Patient is aware when to return to the clinic for a follow-up visit. Patient educated on when it is appropriate to go to the emergency department.   Monia Pouch, FNP-C [facility] [phone]

## 2019-06-18 ENCOUNTER — Encounter: Payer: Self-pay | Admitting: Family Medicine

## 2019-06-19 ENCOUNTER — Other Ambulatory Visit: Payer: Self-pay

## 2019-06-19 MED ORDER — VENLAFAXINE HCL ER 75 MG PO CP24: 75.0000 mg | ORAL_CAPSULE | Freq: Every day | ORAL | 0 refills | Status: DC

## 2019-06-27 ENCOUNTER — Ambulatory Visit: Payer: Medicare HMO

## 2019-07-17 ENCOUNTER — Other Ambulatory Visit: Payer: Self-pay | Admitting: Family Medicine

## 2019-07-17 ENCOUNTER — Encounter: Payer: Self-pay | Admitting: Family Medicine

## 2019-07-17 DIAGNOSIS — E7800 Pure hypercholesterolemia, unspecified: Secondary | ICD-10-CM

## 2019-07-20 ENCOUNTER — Other Ambulatory Visit: Payer: Medicare HMO

## 2019-07-20 ENCOUNTER — Ambulatory Visit: Payer: Medicare HMO | Attending: Internal Medicine

## 2019-07-20 ENCOUNTER — Other Ambulatory Visit: Payer: Self-pay

## 2019-07-20 DIAGNOSIS — I10 Essential (primary) hypertension: Secondary | ICD-10-CM | POA: Diagnosis not present

## 2019-07-20 DIAGNOSIS — Z6828 Body mass index (BMI) 28.0-28.9, adult: Secondary | ICD-10-CM | POA: Diagnosis not present

## 2019-07-20 DIAGNOSIS — E785 Hyperlipidemia, unspecified: Secondary | ICD-10-CM | POA: Diagnosis not present

## 2019-07-20 DIAGNOSIS — R69 Illness, unspecified: Secondary | ICD-10-CM | POA: Diagnosis not present

## 2019-07-20 DIAGNOSIS — E7800 Pure hypercholesterolemia, unspecified: Secondary | ICD-10-CM | POA: Diagnosis not present

## 2019-07-20 DIAGNOSIS — Z125 Encounter for screening for malignant neoplasm of prostate: Secondary | ICD-10-CM | POA: Diagnosis not present

## 2019-07-20 DIAGNOSIS — Z23 Encounter for immunization: Secondary | ICD-10-CM | POA: Insufficient documentation

## 2019-07-20 NOTE — Progress Notes (Signed)
   Covid-19 Vaccination Clinic  Name:  Spencer White    MRN: [id number] DOB: 09/11/1950  07/20/2019  [name] was observed post Covid-19 immunization for 15 minutes without incidence. He was provided with Vaccine Information Sheet and instruction to access the V-Safe system.   [name] was instructed to call 911 with any severe reactions post vaccine: Marland Kitchen Difficulty breathing  . Swelling of your face and throat  . A fast heartbeat  . A bad rash all over your body  . Dizziness and weakness    Immunizations Administered    Name Date Dose VIS Date Route   Pfizer COVID-19 Vaccine 07/20/2019  1:21 PM 0.3 mL 06/09/2019 Intramuscular   Manufacturer: [place]   Lot: [id number]   [place]: [id number]

## 2019-07-21 ENCOUNTER — Telehealth: Payer: Self-pay | Admitting: Family Medicine

## 2019-07-21 LAB — CBC WITH DIFFERENTIAL/PLATELET
Basophils Absolute: 0 10*3/uL (ref 0.0–0.2)
Basos: 1 %
EOS (ABSOLUTE): 0.4 10*3/uL (ref 0.0–0.4)
Eos: 8 %
Hematocrit: 44.2 % (ref 37.5–51.0)
Hemoglobin: 15.1 g/dL (ref 13.0–17.7)
Immature Grans (Abs): 0 10*3/uL (ref 0.0–0.1)
Immature Granulocytes: 0 %
Lymphocytes Absolute: 1.5 10*3/uL (ref 0.7–3.1)
Lymphs: 33 %
MCH: 33.6 pg — ABNORMAL HIGH (ref 26.6–33.0)
MCHC: 34.2 g/dL (ref 31.5–35.7)
MCV: 98 fL — ABNORMAL HIGH (ref 79–97)
Monocytes Absolute: 0.4 10*3/uL (ref 0.1–0.9)
Monocytes: 10 %
Neutrophils Absolute: 2.2 10*3/uL (ref 1.4–7.0)
Neutrophils: 48 %
Platelets: 290 10*3/uL (ref 150–450)
RBC: 4.49 x10E6/uL (ref 4.14–5.80)
RDW: 12 % (ref 11.6–15.4)
WBC: 4.5 10*3/uL (ref 3.4–10.8)

## 2019-07-21 LAB — LIPID PANEL
Chol/HDL Ratio: 4.9 ratio (ref 0.0–5.0)
Cholesterol, Total: 206 mg/dL — ABNORMAL HIGH (ref 100–199)
HDL: 42 mg/dL (ref >39)
LDL Chol Calc (NIH): 150 mg/dL — ABNORMAL HIGH (ref 0–99)
Triglycerides: 75 mg/dL (ref 0–149)
VLDL Cholesterol Cal: 14 mg/dL (ref 5–40)

## 2019-07-21 LAB — THYROID PANEL WITH TSH
Free Thyroxine Index: 1.4 (ref 1.2–4.9)
T3 Uptake Ratio: 27 % (ref 24–39)
T4, Total: 5.1 ug/dL (ref 4.5–12.0)
TSH: 2.72 u[IU]/mL (ref 0.450–4.500)

## 2019-07-21 LAB — CMP14+EGFR
ALT: 24 IU/L (ref 0–44)
AST: 27 IU/L (ref 0–40)
Albumin/Globulin Ratio: 2.3 — ABNORMAL HIGH (ref 1.2–2.2)
Albumin: 4.5 g/dL (ref 3.8–4.8)
Alkaline Phosphatase: 54 IU/L (ref 39–117)
BUN/Creatinine Ratio: 14 (ref 10–24)
BUN: 15 mg/dL (ref 8–27)
Bilirubin Total: 0.6 mg/dL (ref 0.0–1.2)
CO2: 23 mmol/L (ref 20–29)
Calcium: 9.4 mg/dL (ref 8.6–10.2)
Chloride: 104 mmol/L (ref 96–106)
Creatinine, Ser: 1.07 mg/dL (ref 0.76–1.27)
GFR calc Af Amer: 83 mL/min/{1.73_m2} (ref >59)
GFR calc non Af Amer: 71 mL/min/{1.73_m2} (ref >59)
Globulin, Total: 2 g/dL (ref 1.5–4.5)
Glucose: 90 mg/dL (ref 65–99)
Potassium: 4.7 mmol/L (ref 3.5–5.2)
Sodium: 141 mmol/L (ref 134–144)
Total Protein: 6.5 g/dL (ref 6.0–8.5)

## 2019-07-21 LAB — PSA, TOTAL AND FREE
PSA, Free: <0.02 ng/mL
Prostate Specific Ag, Serum: <0.1 ng/mL (ref 0.0–4.0)

## 2019-07-21 LAB — LDL CHOLESTEROL, DIRECT: LDL Direct: 150 mg/dL — ABNORMAL HIGH (ref 0–99)

## 2019-07-21 NOTE — Telephone Encounter (Signed)
Aware of results. 

## 2019-08-09 DIAGNOSIS — B0089 Other herpesviral infection: Secondary | ICD-10-CM | POA: Diagnosis not present

## 2019-08-09 DIAGNOSIS — L820 Inflamed seborrheic keratosis: Secondary | ICD-10-CM | POA: Diagnosis not present

## 2019-08-09 DIAGNOSIS — Z08 Encounter for follow-up examination after completed treatment for malignant neoplasm: Secondary | ICD-10-CM | POA: Diagnosis not present

## 2019-08-09 DIAGNOSIS — Z85828 Personal history of other malignant neoplasm of skin: Secondary | ICD-10-CM | POA: Diagnosis not present

## 2019-08-09 DIAGNOSIS — D1801 Hemangioma of skin and subcutaneous tissue: Secondary | ICD-10-CM | POA: Diagnosis not present

## 2019-08-10 ENCOUNTER — Ambulatory Visit: Payer: Medicare HMO | Attending: Internal Medicine

## 2019-08-10 DIAGNOSIS — Z23 Encounter for immunization: Secondary | ICD-10-CM

## 2019-08-10 NOTE — Progress Notes (Signed)
   Covid-19 Vaccination Clinic  Name:  Spencer White    MRN: [id number] DOB: 1951-04-30  08/10/2019  Spencer White was observed post Covid-19 immunization for 15 minutes without incidence. He was provided with Vaccine Information Sheet and instruction to access the V-Safe system.   Spencer White was instructed to call 911 with any severe reactions post vaccine: Marland Kitchen Difficulty breathing  . Swelling of your face and throat  . A fast heartbeat  . A bad rash all over your body  . Dizziness and weakness    Immunizations Administered    Name Date Dose VIS Date Route   Pfizer COVID-19 Vaccine 08/10/2019  2:03 PM 0.3 mL 06/09/2019 Intramuscular   Manufacturer: [place]   Lot: [id number]   [place]: [id number]

## 2019-12-06 ENCOUNTER — Other Ambulatory Visit: Payer: Self-pay | Admitting: Family Medicine

## 2019-12-06 ENCOUNTER — Ambulatory Visit (INDEPENDENT_AMBULATORY_CARE_PROVIDER_SITE_OTHER): Payer: Medicare HMO | Admitting: Physician Assistant

## 2019-12-06 ENCOUNTER — Encounter: Payer: Self-pay | Admitting: Physician Assistant

## 2019-12-06 ENCOUNTER — Other Ambulatory Visit: Payer: Self-pay

## 2019-12-06 VITALS — BP 137/85 | HR 65 | Temp 97.8°F | Resp 20 | Ht 73.0 in | Wt 193.0 lb

## 2019-12-06 DIAGNOSIS — R69 Illness, unspecified: Secondary | ICD-10-CM | POA: Diagnosis not present

## 2019-12-06 DIAGNOSIS — F419 Anxiety disorder, unspecified: Secondary | ICD-10-CM

## 2019-12-06 DIAGNOSIS — F339 Major depressive disorder, recurrent, unspecified: Secondary | ICD-10-CM

## 2019-12-06 MED ORDER — ALPRAZOLAM 0.25 MG PO TABS: 0.2500 mg | ORAL_TABLET | ORAL | 0 refills | Status: DC | PRN

## 2019-12-06 NOTE — Patient Instructions (Signed)

## 2019-12-06 NOTE — Progress Notes (Signed)
  Subjective:     Patient ID: Spencer White, male   DOB: 05/20/1951, 69 y.o.   MRN: [id number]  HPI Pt with complicated history States all of his problems started some time ago while living in [place] He wife of 24+yrs came home and said she was not happy and filed for divorce Due to this pt returned to his birthplace in [place] Since returning he has had intermit issues with depression/anxiety Prev seen and eval by Psych in [place] - last seen by then in 2011 He was treated with several different meds the last being Effexor-XR Due to ins changes he meds refills got messed up and pt quick cold in Dec of last year Since that time he had done well until around 2-3 weeks ago when he noticed some depressive sx starting to return He then started himself back on the Effexor Now over the last several days has noticed some increase in his anxiety sx States no real attacks but he cannot sit still or concentrate and has had decrease in appetite Sx are worse in the am and improve as the day goes on Currently taking the Effexor in the morning   Review of Systems  Constitutional: Positive for activity change and appetite change.  Psychiatric/Behavioral: Positive for decreased concentration. Negative for agitation, confusion, self-injury and suicidal ideas. The patient is nervous/anxious and is hyperactive.        Objective:   Physical Exam Vitals and nursing note reviewed.  Constitutional:      Appearance: Normal appearance.  Neurological:     Mental Status: He is alert.  Psychiatric:        Mood and Affect: Mood normal.        Thought Content: Thought content normal.        Judgment: Judgment normal.     Comments: Good eye contact and interaction Pt does appear anxious/fidgeting during interview   PHQ 9 reviewed     Assessment:     1. Anxiety   2. Depression, recurrent ([place])        Plan:     Informed some of his anxiety sx may be related to restarting the med at full dose  instead of titrating Since he is 10 days into the current dose will have him just continue current dose Would like for pt to get in to Counseling as well He will check with his ins co and see who is covered and make appt Discussed with Dr Warrick Parisian who will send in a short course of Xanax for his current sx F/U appt in 2-3 weeks, sooner if any problems

## 2019-12-06 NOTE — Progress Notes (Signed)
Discussed with Osa Craver, PA who saw him today and does not have a DEA license with our office. Gave a 10-day prescription of alprazolam to help bridge while he is getting back on the Effexor. Caryl Pina, MD [facility] 12/06/2019, 4:43 PM

## 2019-12-07 ENCOUNTER — Other Ambulatory Visit: Payer: Self-pay | Admitting: Family Medicine

## 2019-12-07 ENCOUNTER — Telehealth: Payer: Self-pay | Admitting: Family Medicine

## 2019-12-07 MED ORDER — ALPRAZOLAM 0.25 MG PO TABS: 0.2500 mg | ORAL_TABLET | ORAL | 0 refills | Status: DC | PRN

## 2019-12-07 NOTE — Telephone Encounter (Signed)
Please resend Xanax script to [facility].

## 2019-12-07 NOTE — Telephone Encounter (Signed)
[facility] calling to request the ALPRAZolam Spencer White) 0.25 MG tablet rx that was sent to [facility] to be cx and resent to them as the pt wants to pick the rx up at [facility].

## 2019-12-07 NOTE — Progress Notes (Signed)
Patient has a follow up appointment scheduled to establish with Dr. Livia Snellen.

## 2019-12-07 NOTE — Progress Notes (Unsigned)
I resent his prescription to medicine pharmacy, please call [facility] and cancel prescription there.

## 2019-12-13 ENCOUNTER — Ambulatory Visit: Payer: Medicare HMO | Admitting: Family Medicine

## 2019-12-14 ENCOUNTER — Encounter: Payer: Self-pay | Admitting: Family Medicine

## 2019-12-14 ENCOUNTER — Ambulatory Visit (INDEPENDENT_AMBULATORY_CARE_PROVIDER_SITE_OTHER): Payer: Medicare HMO | Admitting: Family Medicine

## 2019-12-14 ENCOUNTER — Other Ambulatory Visit: Payer: Self-pay

## 2019-12-14 VITALS — BP 129/79 | HR 73 | Temp 97.8°F | Ht 73.0 in | Wt 188.2 lb

## 2019-12-14 DIAGNOSIS — F339 Major depressive disorder, recurrent, unspecified: Secondary | ICD-10-CM

## 2019-12-14 DIAGNOSIS — F419 Anxiety disorder, unspecified: Secondary | ICD-10-CM | POA: Diagnosis not present

## 2019-12-14 DIAGNOSIS — R69 Illness, unspecified: Secondary | ICD-10-CM | POA: Diagnosis not present

## 2019-12-14 MED ORDER — HYDROXYZINE HCL 25 MG PO TABS: 25.0000 mg | ORAL_TABLET | Freq: Three times a day (TID) | ORAL | 3 refills | Status: DC | PRN

## 2019-12-14 NOTE — Progress Notes (Signed)
BP 129/79   Pulse 73   Temp 97.8 F (36.6 C)   Ht 6\' 1"  (1.854 m)   Wt 188 lb 4 oz (85.4 kg)   SpO2 95%   BMI 24.84 kg/m    Subjective:   Patient ID: Spencer White, male    DOB: 12/10/1950, 69 y.o.   MRN: [id number]  HPI: Spencer White is a 69 y.o. male presenting on 12/14/2019 for Medical Management of Chronic Issues and Anxiety   HPI Patient is coming in today to discuss anxiety and depression.  He says is been building up.  He says he had been taking Effexor for quite many years and then stopped it about 6 months ago and thought he was doing well but then it has increased since.  He denies any suicidal ideations or thoughts of hurting self but he says is just so anxious with feeling depressed.  He has been back on the Effexor about 3 weeks and was using the Xanax.  He is to use the Xanax in the past as well and was on it for quite some time.  He has a PHQ-9 of 24 and a GAD score of 17.  Patient says the longest built up more recently because of stress related to home improvement projects.  He says his appetite is been down he been drinking protein drinks.  He says he wakes up in the nighttime around 3 AM fidgeting has to wear himself out before he can fall back asleep and is not getting good sleep at night because of that.  He says that he has been on the Effexor since around 2011 with Dr. Caprice Beaver, Dr. Caprice Beaver is Practicing.  Relevant past medical, surgical, family and social history reviewed and updated as indicated. Interim medical history since our last visit reviewed. Allergies and medications reviewed and updated.  Review of Systems  Psychiatric/Behavioral: Positive for decreased concentration, dysphoric mood and sleep disturbance. Negative for self-injury and suicidal ideas. The patient is nervous/anxious.     Per HPI unless specifically indicated above   Allergies as of 12/14/2019      Reactions   Latex Other (See Comments)   Gets redness from band-aids        Medication List       Accurate as of December 14, 2019  4:38 PM. If you have any questions, ask your nurse or doctor.        ALPRAZolam 0.25 MG tablet Commonly known as: XANAX Take 1 tablet (0.25 mg total) by mouth as needed for anxiety.   hydrOXYzine 25 MG tablet Commonly known as: ATARAX/VISTARIL Take 1 tablet (25 mg total) by mouth 3 (three) times daily as needed. Started by: Worthy Rancher, MD   multivitamin with minerals Tabs tablet Take 1 tablet by mouth daily.   venlafaxine XR 75 MG 24 hr capsule Commonly known as: EFFEXOR-XR Take 1 capsule (75 mg total) by mouth daily with breakfast.        Objective:   BP 129/79   Pulse 73   Temp 97.8 F (36.6 C)   Ht 6\' 1"  (1.854 m)   Wt 188 lb 4 oz (85.4 kg)   SpO2 95%   BMI 24.84 kg/m   Wt Readings from Last 3 Encounters:  12/14/19 188 lb 4 oz (85.4 kg)  12/06/19 193 lb (87.5 kg)  06/05/19 219 lb (99.3 kg)    Physical Exam Vitals and nursing note reviewed.  Constitutional:      General: He  is not in acute distress.    Appearance: He is well-developed. He is not diaphoretic.  Eyes:     General: No scleral icterus.    Conjunctiva/sclera: Conjunctivae normal.  Neck:     Thyroid: No thyromegaly.  Cardiovascular:     Rate and Rhythm: Normal rate and regular rhythm.     Heart sounds: Normal heart sounds. No murmur heard.   Pulmonary:     Effort: Pulmonary effort is normal. No respiratory distress.     Breath sounds: Normal breath sounds. No wheezing.  Musculoskeletal:        General: Normal range of motion.     Cervical back: Neck supple.  Lymphadenopathy:     Cervical: No cervical adenopathy.  Skin:    General: Skin is warm and dry.     Findings: No rash.  Neurological:     Mental Status: He is alert and oriented to person, place, and time.     Coordination: Coordination normal.  Psychiatric:        Behavior: Behavior normal.       Assessment & Plan:   Problem List Items Addressed This Visit       Other   Depression, recurrent ([place]) - Primary   Relevant Medications   hydrOXYzine (ATARAX/VISTARIL) 25 MG tablet   Other Relevant Orders   Ambulatory referral to Psychiatry   Anxiety   Relevant Medications   hydrOXYzine (ATARAX/VISTARIL) 25 MG tablet   Other Relevant Orders   Ambulatory referral to Psychiatry      Will start hydroxyzine for anxiety, also recommended continuing the Effexor for now is only been on it a little more than 3 weeks and will see where it goes from there. Follow up plan: Return in about 3 weeks (around 01/04/2020), or if symptoms worsen or fail to improve, for Follow-up in 2 to 3 weeks for her depression and anxiety.  Counseling provided for all of the vaccine components Orders Placed This Encounter  Procedures  . Ambulatory referral to Psychiatry    Caryl Pina, MD [facility] 12/14/2019, 4:38 PM

## 2019-12-29 ENCOUNTER — Encounter: Payer: Self-pay | Admitting: Family Medicine

## 2019-12-29 ENCOUNTER — Other Ambulatory Visit: Payer: Self-pay

## 2019-12-29 ENCOUNTER — Ambulatory Visit (INDEPENDENT_AMBULATORY_CARE_PROVIDER_SITE_OTHER): Payer: Medicare HMO | Admitting: Family Medicine

## 2019-12-29 VITALS — BP 120/69 | HR 75 | Ht 73.0 in | Wt 184.0 lb

## 2019-12-29 DIAGNOSIS — F419 Anxiety disorder, unspecified: Secondary | ICD-10-CM | POA: Diagnosis not present

## 2019-12-29 DIAGNOSIS — F339 Major depressive disorder, recurrent, unspecified: Secondary | ICD-10-CM | POA: Diagnosis not present

## 2019-12-29 DIAGNOSIS — R69 Illness, unspecified: Secondary | ICD-10-CM | POA: Diagnosis not present

## 2019-12-29 MED ORDER — VENLAFAXINE HCL ER 150 MG PO CP24: 150.0000 mg | ORAL_CAPSULE | Freq: Every day | ORAL | 2 refills | Status: DC

## 2019-12-29 NOTE — Patient Instructions (Signed)
Double up on your Effexor and take 150 mg, I have sent a new prescription of the 150 mg dose to the pharmacy for when you finish off the 75 mg tablets

## 2019-12-29 NOTE — Progress Notes (Signed)
BP 120/69   Pulse 75   Ht 6\' 1"  (1.854 m)   Wt 184 lb (83.5 kg)   BMI 24.28 kg/m    Subjective:   Patient ID: Spencer White, male    DOB: [birth year], 69 y.o.   MRN: [id number]  HPI: Spencer White is a 69 y.o. male presenting on 12/29/2019 for Medical Management of Chronic Issues, Depression, and Anxiety   HPI Anxiety recheck Patient is coming in for anxiety recheck, he is getting back on his Effexor and has been on it for weeks and the hydroxyzine during the day and it does seem to be helping some.  He does have an appointment with a psychiatrist later this month who is very excited to see, he is doing somewhat better but still has a lot of anxiety buildup.  He denies any real suicidal action or thoughts but he does have some passing ideations.  Relevant past medical, surgical, family and social history reviewed and updated as indicated. Interim medical history since our last visit reviewed. Allergies and medications reviewed and updated.  Review of Systems  Constitutional: Negative for chills and fever.  Respiratory: Negative for shortness of breath and wheezing.   Cardiovascular: Negative for chest pain and leg swelling.  Musculoskeletal: Negative for back pain and gait problem.  Skin: Negative for rash.  Psychiatric/Behavioral: Positive for dysphoric mood. Negative for self-injury, sleep disturbance and suicidal ideas. The patient is nervous/anxious.   All other systems reviewed and are negative.   Per HPI unless specifically indicated above   Allergies as of 12/29/2019      Reactions   Latex Other (See Comments)   Gets redness from band-aids      Medication List       Accurate as of December 29, 2019  3:01 PM. If you have any questions, ask your nurse or doctor.        STOP taking these medications   ALPRAZolam 0.25 MG tablet Commonly known as: XANAX Stopped by: Fransisca Kaufmann Dayvon Dax, MD     TAKE these medications   hydrOXYzine 25 MG tablet Commonly known as:  ATARAX/VISTARIL Take 1 tablet (25 mg total) by mouth 3 (three) times daily as needed.   multivitamin with minerals Tabs tablet Take 1 tablet by mouth daily.   venlafaxine XR 75 MG 24 hr capsule Commonly known as: EFFEXOR-XR Take 1 capsule (75 mg total) by mouth daily with breakfast.        Objective:   BP 120/69   Pulse 75   Ht 6\' 1"  (1.854 m)   Wt 184 lb (83.5 kg)   BMI 24.28 kg/m   Wt Readings from Last 3 Encounters:  12/29/19 184 lb (83.5 kg)  12/14/19 188 lb 4 oz (85.4 kg)  12/06/19 193 lb (87.5 kg)    Physical Exam Vitals and nursing note reviewed.  Constitutional:      General: He is not in acute distress.    Appearance: He is well-developed. He is not diaphoretic.  Eyes:     General: No scleral icterus.    Conjunctiva/sclera: Conjunctivae normal.  Neck:     Thyroid: No thyromegaly.  Cardiovascular:     Rate and Rhythm: Normal rate and regular rhythm.     Heart sounds: Normal heart sounds. No murmur heard.   Pulmonary:     Effort: Pulmonary effort is normal. No respiratory distress.     Breath sounds: Normal breath sounds. No wheezing.  Musculoskeletal:  General: Normal range of motion.     Cervical back: Neck supple.  Lymphadenopathy:     Cervical: No cervical adenopathy.  Skin:    General: Skin is warm and dry.     Findings: No rash.  Neurological:     Mental Status: He is alert and oriented to person, place, and time.     Coordination: Coordination normal.  Psychiatric:        Mood and Affect: Mood is anxious and depressed.        Behavior: Behavior normal.        Thought Content: Thought content does not include suicidal ideation. Thought content does not include suicidal plan.       Assessment & Plan:   Problem List Items Addressed This Visit      Other   Depression, recurrent ([place]) - Primary   Relevant Medications   venlafaxine XR (EFFEXOR-XR) 150 MG 24 hr capsule   Anxiety   Relevant Medications   venlafaxine XR (EFFEXOR-XR)  150 MG 24 hr capsule      Will have patient double up on his Effexor and take 150 mg and sent a new prescription for him.  He does have an appointment later this month with psychiatry. Follow up plan: Return in about 4 weeks (around 01/26/2020), or if symptoms worsen or fail to improve, for Follow-up anxiety.  Counseling provided for all of the vaccine components No orders of the defined types were placed in this encounter.   Caryl Pina, MD [facility] 12/29/2019, 3:01 PM

## 2020-01-13 ENCOUNTER — Telehealth (HOSPITAL_COMMUNITY): Payer: Medicare HMO | Admitting: Psychiatry

## 2020-01-17 ENCOUNTER — Telehealth (INDEPENDENT_AMBULATORY_CARE_PROVIDER_SITE_OTHER): Payer: Medicare HMO | Admitting: Psychiatry

## 2020-01-17 ENCOUNTER — Other Ambulatory Visit: Payer: Self-pay

## 2020-01-17 ENCOUNTER — Encounter (HOSPITAL_COMMUNITY): Payer: Self-pay | Admitting: Psychiatry

## 2020-01-17 DIAGNOSIS — F339 Major depressive disorder, recurrent, unspecified: Secondary | ICD-10-CM

## 2020-01-17 DIAGNOSIS — F411 Generalized anxiety disorder: Secondary | ICD-10-CM

## 2020-01-17 DIAGNOSIS — R69 Illness, unspecified: Secondary | ICD-10-CM | POA: Diagnosis not present

## 2020-01-17 NOTE — Progress Notes (Signed)
Psychiatric Initial Adult Assessment   Patient Identification: Spencer White MRN:  [id number] Date of Evaluation:  01/17/2020 Referral Source: Vonna Kotyk Dettinger MD Chief Complaint:  Anxiety.  Interview was conducted using videoconferencing application and I verified that I was speaking with the correct person using two identifiers. I discussed the limitations of evaluation and management by telemedicine and  the availability of in person appointments. Patient expressed understanding and agreed to proceed. Patient location - home; physician - home office.  Visit Diagnosis:    ICD-10-CM   1. GAD (generalized anxiety disorder)  F41.1   2. Depression, recurrent ([place])  F33.9     History of Present Illness:  Spencer White is a 69 yo divorced male with a hx of anxiety and depression who became acutely anxious (panic attacks) and depressed in early July this year. He had been on venlafaxine 75 mg daily since 2011 and was feeling emotionally stable. It was initially prescribed for depression and excessive worrying at the time when his wife of 24 years left him. He also was in psychotherapy at that time and prescribed alprazolam for panic attacks. Once he started feeling better he stopped using alprazolam but remained on venlafaxine until December 2020 when he run out and forgot to request a refill [facility]. He did not experience any withdrawal symptoms and his mood did not deteriorate so he did not feel need to restart it. 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 It thus seems that his generalized anxiety symptoms has returned so he decided that he needs to go back on venlafaxine. He started again taking 75 mg and his anxiety markedly increased: he was having panic attacks, worrying all day long, waking up  with panic attacks at night, lost appetite and his ability to ficus suffered. He was also feeling depressed, lost appetite,  Was having low self esteem, low motivation. Dr. Warrick Parisian added hydroxyzine as needed for anxiety which Spencer White was taking 3-4 times a day. Dose of venlafaxine was then increase to 150 mg about two weeks ago and Spencer White now reports that his mood noticeably improved: less depressed, less anxious, no panic attacks, sleep is still interrupted but he does not wake up in fear and is able to fall asleep again easily. He was able to cut down on hydroxyzine use to 1-2 daily. The only adverse effects he noticed is xerostomia and constipation which he did not experience when he was on 75 mg of venlafaxine. He is not, and never was, suicidal. He has no hx of mania, psychosis, alcohol or drug abuse. There is no psychiatric hx in his family.  Associated Signs/Symptoms: Depression Symptoms:  depressed mood, difficulty concentrating, anxiety, disturbed sleep, (Hypo) Manic Symptoms:  None Anxiety Symptoms:  Excessive Worry, Psychotic Symptoms:  None PTSD Symptoms: Negative  Past Psychiatric History: see above  Previous Psychotropic Medications: Yes   Substance Abuse History in the last 12 months:  No.  Consequences of Substance Abuse: NA  Past Medical History:  Past Medical History:  Diagnosis Date  . Allergy    seasonal allergies  . Asthma    as a child  . BCC (basal cell carcinoma), scalp/neck   . Depression   . Diverticulosis   . Duodenitis   . Hiatal hernia   . Hyperlipidemia   . Prostate cancer ([place])  2011  . Schatzki's ring   . Status post dilation of esophageal narrowing   . Tubular adenoma of colon     Past Surgical History:  Procedure Laterality Date  . COLONOSCOPY    . PROSTATECTOMY  07/2009  . TONSILLECTOMY      Family Psychiatric History: None.  Family History:  Family History  Problem Relation Age of Onset  . Lung cancer Mother   . Lung cancer  Brother   . Heart disease Father 7       Angioplasty stent  . Hypertension Father   . AAA (abdominal aortic aneurysm) Father   . Cancer Maternal Grandmother        bladder  . Asthma Paternal Grandmother   . Colon cancer Neg Hx   . Colon polyps Neg Hx   . Kidney disease Neg Hx   . Gallbladder disease Neg Hx   . Esophageal cancer Neg Hx   . Diabetes Neg Hx   . Stomach cancer Neg Hx   . Rectal cancer Neg Hx     Social History:   Social History   Socioeconomic History  . Marital status: Divorced    Spouse name: Not on file  . Number of children: 0  . Years of education: Not on file  . Highest education level: Not on file  Occupational History  . Occupation: Environmental manager  Tobacco Use  . Smoking status: Never Smoker  . Smokeless tobacco: Never Used  Vaping Use  . Vaping Use: Never used  Substance and Sexual Activity  . Alcohol use: Yes    Alcohol/week: 0.0 standard drinks    Comment: 1 glass red wine 1-2 a week  . Drug use: No  . Sexual activity: Not on file  Other Topics Concern  . Not on file  Social History Narrative   Lives alone but has 6 cats and a dog.   Supportive brother and sister in law live close by.   Social Determinants of Health   Financial Resource Strain:   . Difficulty of Paying Living Expenses:   Food Insecurity:   . Worried About Charity fundraiser in the Last Year:   . Arboriculturist in the Last Year:   Transportation Needs:   . Film/video editor (Medical):   Marland Kitchen Lack of Transportation (Non-Medical):   Physical Activity:   . Days of Exercise per Week:   . Minutes of Exercise per Session:   Stress:   . Feeling of Stress :   Social Connections:   . Frequency of Communication with Friends and Family:   . Frequency of Social Gatherings with Friends and Family:   . Attends Religious Services:   . Active Member of Clubs or Organizations:   . Attends Archivist Meetings:   Marland Kitchen Marital Status:     Allergies:    Allergies  Allergen Reactions  . Latex Other (See Comments)    Gets redness from band-aids    Metabolic Disorder Labs: No results found for: HGBA1C, MPG No results found for: PROLACTIN Lab Results  Component Value Date   CHOL 206 (H) 07/20/2019   TRIG 75 07/20/2019   HDL 42 07/20/2019   CHOLHDL 4.9 07/20/2019   LDLCALC 150 (H) 07/20/2019   LDLCALC 119 (H) 09/04/2013   Lab Results  Component Value Date   TSH 2.720 07/20/2019    Therapeutic Level Labs: No results found for: LITHIUM No results found for: CBMZ No results found for: VALPROATE  Current Medications: Current Outpatient Medications  Medication Sig Dispense Refill  . hydrOXYzine (ATARAX/VISTARIL) 25 MG tablet Take 1 tablet (25 mg total) by mouth 3 (three) times daily as needed. 90 tablet 3  . Multiple Vitamin (MULTIVITAMIN WITH MINERALS) TABS tablet Take 1 tablet by mouth daily.    Marland Kitchen venlafaxine XR (EFFEXOR-XR) 150 MG 24 hr capsule Take 1 capsule (150 mg total) by mouth daily with breakfast. 30 capsule 2   No current facility-administered medications for this visit.    Psychiatric Specialty Exam: Review of Systems  Psychiatric/Behavioral: Positive for sleep disturbance. The patient is nervous/anxious.   All other systems reviewed and are negative.   There were no vitals taken for this visit.There is no height or weight on file to calculate BMI.  General Appearance: Casual and Well Groomed  Eye Contact:  Good  Speech:  Clear and Coherent and Normal Rate  Volume:  Normal  Mood:  Anxious  Affect:  Full Range  Thought Process:  Goal Directed and Linear  Orientation:  Full (Time, Place, and Person)  Thought Content:  Rumination  Suicidal Thoughts:  No  Homicidal Thoughts:  No  Memory:  Immediate;   Good Recent;   Good Remote;   Good  Judgement:  Good  Insight:  Fair  Psychomotor Activity:  Normal  Concentration:  Concentration: Good  Recall:  Good  Fund of Knowledge:Good  Language: Good  Akathisia:   Negative  Handed:  Right  AIMS (if indicated):  not done  Assets:  Communication Skills Desire for Improvement Financial Resources/Insurance Housing Physical Health Talents/Skills  ADL's:  Intact  Cognition: WNL  Sleep:  Fair   Screenings: GAD-7     Office Visit from 12/14/2019 in [facility] Office Visit from 12/06/2019 in [facility]  Total GAD-7 Score 17 16    Mini-Mental     Office Visit from 12/24/2017 in [facility]  Total Score (max 30 points ) 30    PHQ2-9     Office Visit from 12/29/2019 in [facility] Visit from 12/14/2019 in [facility] Visit from 12/06/2019 in [facility] Office Visit from 06/05/2019 in [facility] Office Visit from 12/24/2017 in [place]  PHQ-2 Total Score 6 6 4  0 0  PHQ-9 Total Score -- 24 12 -- --      Assessment and Plan:  69 yo divorced male with a hx of anxiety and depression who became acutely anxious (panic attacks) and depressed in early July this year. He had been on venlafaxine 75 mg daily since 2011 (earlier on citalopram) and was feeling emotionally stable. It was initially prescribed for depression and excessive worrying at the time when his wife of 24 years left him. He also was in psychotherapy at that time and prescribed alprazolam for panic attacks. Once he started feeling better he stopped using alprazolam but remained on venlafaxine until December 2020 when he run out and forgot to request a refill through [facility]. He did not experience any withdrawal symptoms and his mood did not deteriorate so he did not feel need to restart it. 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 It thus seems that his generalized anxiety symptoms has returned so he decided that he  needs to go back on venlafaxine. He started again taking 75 mg and his anxiety markedly increased: he was having panic attacks, worrying all day long, waking up with panic attacks at night, lost appetite and his ability to focus suffered. He was also feeling depressed, lost appetite, was having low self esteem, low motivation. It is possible that starting on 75 mg triggered panic type anxiety. Dr. Warrick Parisian added hydroxyzine as needed for anxiety which Spencer White was taking 3-4 times a day. Dose of venlafaxine was then increase to 150 mg about two weeks ago and Spencer White now reports that his mood noticeably improved: less depressed, less anxious, no panic attacks, sleep is still interrupted but he does not wake up in fear and is able to fall asleep again easily. He was able to cut down on hydroxyzine use to 1-2 daily. The only adverse effects he noticed is xerostomia and constipation which he did not experience when he was on 75 mg of venlafaxine. He is not, and never was, suicidal. He has no hx of mania, psychosis, alcohol or drug abuse.  Dx: GAD (primary); Depressive disorder ; Panic disorder, resolved  Plan: Given considerable improvement of his symptoms ever since dose of venlafaxine ws increased I recommended continuing on 150 mg daily (Spencer White asked if he can go back down to 75 mg so he can have it increased should mood decline in the future - I think it would be mistake to decrease it). He should be able to discontinue hydroxyzine soon - I suspect it may be responsible for dry mouth/constipation although venlafaxine can cause the same. I offered to help set up a counseling appointment (CBT would be highly advisable in his case) but he said he will think about it and will call our office should he decide to engage in it. He prefers to continue to have Dr. Warrick Parisian to prescribe him Effexor and will call our  office if needed. The plan was discussed with patient who had an opportunity to ask questions and these were all answered. I spend 45 minutes in video clinical contact with the patient and devoted approximately 50% of this time to explanation of diagnosis, discussion of treatment options and med education.  Stephanie Acre, MD 7/21/20213:08 PM

## 2020-01-29 ENCOUNTER — Ambulatory Visit (INDEPENDENT_AMBULATORY_CARE_PROVIDER_SITE_OTHER): Payer: Medicare HMO | Admitting: Family Medicine

## 2020-01-29 ENCOUNTER — Encounter: Payer: Self-pay | Admitting: Family Medicine

## 2020-01-29 ENCOUNTER — Other Ambulatory Visit: Payer: Self-pay

## 2020-01-29 VITALS — BP 112/66 | HR 68 | Temp 98.0°F | Ht 73.0 in | Wt 190.0 lb

## 2020-01-29 DIAGNOSIS — F411 Generalized anxiety disorder: Secondary | ICD-10-CM

## 2020-01-29 DIAGNOSIS — F339 Major depressive disorder, recurrent, unspecified: Secondary | ICD-10-CM | POA: Diagnosis not present

## 2020-01-29 DIAGNOSIS — R69 Illness, unspecified: Secondary | ICD-10-CM | POA: Diagnosis not present

## 2020-01-29 NOTE — Progress Notes (Signed)
BP 112/66   Pulse 68   Temp 98 F (36.7 C)   Ht 6\' 1"  (1.854 m)   Wt 190 lb (86.2 kg)   SpO2 99%   BMI 25.07 kg/m    Subjective:   Patient ID: Spencer White, male    DOB: May 04, 1951, 69 y.o.   MRN: [id number]  HPI: [name] is a 69 y.o. male presenting on 01/29/2020 for Medical Management of Chronic Issues and Anxiety   HPI Anxiety recheck Patient is coming in for anxiety recheck today.  He was given the hydroxyzine restarted on the 150 mg of the Effexor and he feels like he is doing very well and has not had any major panic attacks and feels like things are going well.  He denies any suicidal ideations or thoughts of hurting himself.  He is sleeping a lot better and having all more energy through the day as well.  Relevant past medical, surgical, family and social history reviewed and updated as indicated. Interim medical history since our last visit reviewed. Allergies and medications reviewed and updated.  Review of Systems  Constitutional: Negative for chills and fever.  Respiratory: Negative for shortness of breath and wheezing.   Cardiovascular: Negative for chest pain and leg swelling.  Musculoskeletal: Negative for back pain and gait problem.  Skin: Negative for rash.  Psychiatric/Behavioral: Negative for dysphoric mood, self-injury, sleep disturbance and suicidal ideas. The patient is nervous/anxious.   All other systems reviewed and are negative.   Per HPI unless specifically indicated above   Allergies as of 01/29/2020      Reactions   Latex Other (See Comments)   Gets redness from band-aids      Medication List       Accurate as of January 29, 2020  4:55 PM. If you have any questions, ask your nurse or doctor.        hydrOXYzine 25 MG tablet Commonly known as: ATARAX/VISTARIL Take 1 tablet (25 mg total) by mouth 3 (three) times daily as needed.   multivitamin with minerals Tabs tablet Take 1 tablet by mouth daily.   venlafaxine XR 150 MG 24  hr capsule Commonly known as: EFFEXOR-XR Take 1 capsule (150 mg total) by mouth daily with breakfast.        Objective:   BP 112/66   Pulse 68   Temp 98 F (36.7 C)   Ht 6\' 1"  (1.854 m)   Wt 190 lb (86.2 kg)   SpO2 99%   BMI 25.07 kg/m   Wt Readings from Last 3 Encounters:  01/29/20 190 lb (86.2 kg)  12/29/19 184 lb (83.5 kg)  12/14/19 188 lb 4 oz (85.4 kg)    Physical Exam Vitals and nursing note reviewed.  Constitutional:      General: He is not in acute distress.    Appearance: He is well-developed. He is not diaphoretic.  Eyes:     General: No scleral icterus.    Conjunctiva/sclera: Conjunctivae normal.  Neck:     Thyroid: No thyromegaly.  Cardiovascular:     Rate and Rhythm: Normal rate and regular rhythm.     Heart sounds: Normal heart sounds. No murmur heard.   Pulmonary:     Effort: Pulmonary effort is normal. No respiratory distress.     Breath sounds: Normal breath sounds. No wheezing.  Musculoskeletal:        General: Normal range of motion.     Cervical back: Neck supple.  Lymphadenopathy:  Cervical: No cervical adenopathy.  Skin:    General: Skin is warm and dry.     Findings: No rash.  Neurological:     Mental Status: He is alert and oriented to person, place, and time.     Coordination: Coordination normal.  Psychiatric:        Behavior: Behavior normal.       Assessment & Plan:   Problem List Items Addressed This Visit      Other   Depression, recurrent ([place])   GAD (generalized anxiety disorder) - Primary      Patient seems to be doing good with anxiety, will continue with the hydroxyzine. Follow up plan: Return in about 3 months (around 04/30/2020), or if symptoms worsen or fail to improve, for Anxiety cholesterol recheck.  Counseling provided for all of the vaccine components No orders of the defined types were placed in this encounter.   Caryl Pina, MD [facility] 01/29/2020, 4:55  PM

## 2020-03-27 ENCOUNTER — Other Ambulatory Visit: Payer: Self-pay | Admitting: Family Medicine

## 2020-03-27 DIAGNOSIS — F419 Anxiety disorder, unspecified: Secondary | ICD-10-CM

## 2020-03-27 DIAGNOSIS — F339 Major depressive disorder, recurrent, unspecified: Secondary | ICD-10-CM

## 2020-05-01 ENCOUNTER — Other Ambulatory Visit: Payer: Self-pay

## 2020-05-01 ENCOUNTER — Ambulatory Visit (INDEPENDENT_AMBULATORY_CARE_PROVIDER_SITE_OTHER): Payer: Medicare HMO | Admitting: Family Medicine

## 2020-05-01 ENCOUNTER — Encounter: Payer: Self-pay | Admitting: Family Medicine

## 2020-05-01 VITALS — BP 119/73 | HR 67 | Ht 73.0 in | Wt 209.0 lb

## 2020-05-01 DIAGNOSIS — R69 Illness, unspecified: Secondary | ICD-10-CM | POA: Diagnosis not present

## 2020-05-01 DIAGNOSIS — F411 Generalized anxiety disorder: Secondary | ICD-10-CM | POA: Diagnosis not present

## 2020-05-01 DIAGNOSIS — F339 Major depressive disorder, recurrent, unspecified: Secondary | ICD-10-CM

## 2020-05-01 DIAGNOSIS — Z23 Encounter for immunization: Secondary | ICD-10-CM | POA: Diagnosis not present

## 2020-05-01 NOTE — Progress Notes (Signed)
BP 119/73   Pulse 67   Ht 6\' 1"  (1.854 m)   Wt 209 lb (94.8 kg)   BMI 27.57 kg/m    Subjective:   Patient ID: Spencer White, male    DOB: 1951/03/11, 69 y.o.   MRN: [id number]  HPI: Spencer White is a 69 y.o. male presenting on 05/01/2020 for Medical Management of Chronic Issues and Anxiety   HPI Patient is coming in for anxiety and depression recheck.  He feels like he is doing very well and wants to discuss possibly reducing medications.  He will wait a little bit though because he does understand where it sat.  Patient denies any suicidal ideations.  He says things are going really well.  He has gained little weight he is can refocus on getting his weight back down.  Relevant past medical, surgical, family and social history reviewed and updated as indicated. Interim medical history since our last visit reviewed. Allergies and medications reviewed and updated.  Review of Systems  Constitutional: Negative for chills and fever.  Respiratory: Negative for shortness of breath and wheezing.   Cardiovascular: Negative for chest pain and leg swelling.  Musculoskeletal: Negative for back pain and gait problem.  Skin: Negative for rash.  Psychiatric/Behavioral: Negative for dysphoric mood, self-injury, sleep disturbance and suicidal ideas. The patient is nervous/anxious.   All other systems reviewed and are negative.   Per HPI unless specifically indicated above   Allergies as of 05/01/2020      Reactions   Latex Other (See Comments)   Gets redness from band-aids      Medication List       Accurate as of May 01, 2020  3:37 PM. If you have any questions, ask your nurse or doctor.        STOP taking these medications   hydrOXYzine 25 MG tablet Commonly known as: ATARAX/VISTARIL Stopped by: Fransisca Kaufmann Hakim Minniefield, MD     TAKE these medications   multivitamin with minerals Tabs tablet Take 1 tablet by mouth daily.   venlafaxine XR 150 MG 24 hr capsule Commonly  known as: EFFEXOR-XR TAKE 1 CAPSULE DAILY WITH BREAKFAST        Objective:   BP 119/73   Pulse 67   Ht 6\' 1"  (1.854 m)   Wt 209 lb (94.8 kg)   BMI 27.57 kg/m   Wt Readings from Last 3 Encounters:  05/01/20 209 lb (94.8 kg)  01/29/20 190 lb (86.2 kg)  12/29/19 184 lb (83.5 kg)    Physical Exam Vitals and nursing note reviewed.  Constitutional:      General: He is not in acute distress.    Appearance: He is well-developed. He is not diaphoretic.  Eyes:     General: No scleral icterus.    Conjunctiva/sclera: Conjunctivae normal.  Neck:     Thyroid: No thyromegaly.  Cardiovascular:     Rate and Rhythm: Normal rate and regular rhythm.     Heart sounds: Normal heart sounds. No murmur heard.   Pulmonary:     Effort: Pulmonary effort is normal. No respiratory distress.     Breath sounds: Normal breath sounds. No wheezing.  Musculoskeletal:        General: Normal range of motion.     Cervical back: Neck supple.  Lymphadenopathy:     Cervical: No cervical adenopathy.  Skin:    General: Skin is warm and dry.     Findings: No rash.  Neurological:  Mental Status: He is alert and oriented to person, place, and time.     Coordination: Coordination normal.  Psychiatric:        Behavior: Behavior normal.     Results for orders placed or performed in visit on 07/20/19  LDL Cholesterol, Direct  Result Value Ref Range   LDL Direct 150 (H) 0 - 99 mg/dL    Assessment & Plan:   Problem List Items Addressed This Visit      Other   Depression, recurrent ([place])   GAD (generalized anxiety disorder) - Primary      Patient is doing well on the Effexor, will continue it, see back in 3 months for physical. Follow up plan: Return in about 3 months (around 08/01/2020), or if symptoms worsen or fail to improve, for Physical exam and blood work and chronic medical issues.  Counseling provided for all of the vaccine components No orders of the defined types were placed in this  encounter.   Caryl Pina, MD [facility] 05/01/2020, 3:37 PM

## 2020-05-20 DIAGNOSIS — L821 Other seborrheic keratosis: Secondary | ICD-10-CM | POA: Diagnosis not present

## 2020-05-20 DIAGNOSIS — L820 Inflamed seborrheic keratosis: Secondary | ICD-10-CM | POA: Diagnosis not present

## 2020-05-20 DIAGNOSIS — C44619 Basal cell carcinoma of skin of left upper limb, including shoulder: Secondary | ICD-10-CM | POA: Diagnosis not present

## 2020-05-20 DIAGNOSIS — D225 Melanocytic nevi of trunk: Secondary | ICD-10-CM | POA: Diagnosis not present

## 2020-05-20 DIAGNOSIS — L918 Other hypertrophic disorders of the skin: Secondary | ICD-10-CM | POA: Diagnosis not present

## 2020-05-29 ENCOUNTER — Other Ambulatory Visit: Payer: Self-pay | Admitting: Family Medicine

## 2020-05-29 DIAGNOSIS — F419 Anxiety disorder, unspecified: Secondary | ICD-10-CM

## 2020-05-29 DIAGNOSIS — F339 Major depressive disorder, recurrent, unspecified: Secondary | ICD-10-CM

## 2020-06-06 DIAGNOSIS — R69 Illness, unspecified: Secondary | ICD-10-CM | POA: Diagnosis not present

## 2020-06-13 DIAGNOSIS — H5203 Hypermetropia, bilateral: Secondary | ICD-10-CM | POA: Diagnosis not present

## 2020-08-05 ENCOUNTER — Ambulatory Visit: Payer: Medicare HMO | Admitting: Family Medicine

## 2020-08-26 ENCOUNTER — Encounter: Payer: Medicare HMO | Admitting: Family Medicine

## 2020-09-06 ENCOUNTER — Other Ambulatory Visit: Payer: Self-pay | Admitting: Family Medicine

## 2020-09-06 DIAGNOSIS — F339 Major depressive disorder, recurrent, unspecified: Secondary | ICD-10-CM

## 2020-09-06 DIAGNOSIS — F419 Anxiety disorder, unspecified: Secondary | ICD-10-CM

## 2020-09-19 ENCOUNTER — Encounter: Payer: Medicare HMO | Admitting: Family Medicine

## 2020-10-08 ENCOUNTER — Other Ambulatory Visit: Payer: Self-pay | Admitting: Family Medicine

## 2020-10-08 DIAGNOSIS — F339 Major depressive disorder, recurrent, unspecified: Secondary | ICD-10-CM

## 2020-10-08 DIAGNOSIS — F419 Anxiety disorder, unspecified: Secondary | ICD-10-CM

## 2020-10-14 ENCOUNTER — Encounter: Payer: Medicare HMO | Admitting: Family Medicine

## 2020-11-07 ENCOUNTER — Ambulatory Visit (INDEPENDENT_AMBULATORY_CARE_PROVIDER_SITE_OTHER): Payer: Medicare HMO | Admitting: Family Medicine

## 2020-11-07 ENCOUNTER — Other Ambulatory Visit: Payer: Self-pay

## 2020-11-07 ENCOUNTER — Encounter: Payer: Self-pay | Admitting: Family Medicine

## 2020-11-07 VITALS — BP 136/78 | HR 58 | Ht 73.0 in | Wt 221.0 lb

## 2020-11-07 DIAGNOSIS — Z125 Encounter for screening for malignant neoplasm of prostate: Secondary | ICD-10-CM

## 2020-11-07 DIAGNOSIS — R69 Illness, unspecified: Secondary | ICD-10-CM | POA: Diagnosis not present

## 2020-11-07 DIAGNOSIS — Z0001 Encounter for general adult medical examination with abnormal findings: Secondary | ICD-10-CM | POA: Diagnosis not present

## 2020-11-07 DIAGNOSIS — I10 Essential (primary) hypertension: Secondary | ICD-10-CM

## 2020-11-07 DIAGNOSIS — F339 Major depressive disorder, recurrent, unspecified: Secondary | ICD-10-CM

## 2020-11-07 DIAGNOSIS — E7800 Pure hypercholesterolemia, unspecified: Secondary | ICD-10-CM | POA: Diagnosis not present

## 2020-11-07 DIAGNOSIS — Z0000 Encounter for general adult medical examination without abnormal findings: Secondary | ICD-10-CM

## 2020-11-07 DIAGNOSIS — F419 Anxiety disorder, unspecified: Secondary | ICD-10-CM

## 2020-11-07 MED ORDER — VENLAFAXINE HCL ER 150 MG PO CP24: 150.0000 mg | ORAL_CAPSULE | Freq: Every day | ORAL | 3 refills | Status: DC

## 2020-11-07 NOTE — Progress Notes (Signed)
Is reasonable  BP 136/78   Pulse (!) 58   Ht '6\' 1"'  (1.854 m)   Wt 221 lb (100.2 kg)   SpO2 98%   BMI 29.16 kg/m    Subjective:   Patient ID: Spencer White, male    DOB: 06-24-1951, 70 y.o.   MRN: [id number]  HPI: Spencer White is a 70 y.o. male presenting on 11/07/2020 for Medical Management of Chronic Issues (CPE)   HPI Physical exam and recheck on depression Patient is coming in for physical exam and recheck of depression.  He says he is doing very well on his Effexor and denies any major issues or suicidal ideations and feels like it is going well for him.  He did state that he had 1 episode of blood in the bowl 2 days ago with a bowel movement but he had been constipated and then now he does has a little bit when he wipes on the tissue but not blood in the bowl anymore.  He denies any lightheadedness or dizziness or pain with bowel movements except for when he was having to strain a lot but he says that is improved.  Relevant past medical, surgical, family and social history reviewed and updated as indicated. Interim medical history since our last visit reviewed. Allergies and medications reviewed and updated.  Review of Systems  Constitutional: Negative for chills and fever.  HENT: Negative for ear pain and tinnitus.   Eyes: Negative for pain.  Respiratory: Negative for cough, shortness of breath and wheezing.   Cardiovascular: Negative for chest pain, palpitations and leg swelling.  Gastrointestinal: Negative for abdominal pain, blood in stool, constipation and diarrhea.  Genitourinary: Negative for dysuria and hematuria.  Musculoskeletal: Negative for back pain and myalgias.  Skin: Negative for rash.  Neurological: Negative for dizziness, weakness and headaches.  Psychiatric/Behavioral: Negative for suicidal ideas.    Per HPI unless specifically indicated above   Allergies as of 11/07/2020      Reactions   Latex Other (See Comments)   Gets redness from band-aids       Medication List       Accurate as of Nov 07, 2020  3:43 PM. If you have any questions, ask your nurse or doctor.        multivitamin with minerals Tabs tablet Take 1 tablet by mouth daily.   timolol 0.25 % ophthalmic solution Commonly known as: TIMOPTIC Place 1 drop into both eyes 2 (two) times daily.   venlafaxine XR 150 MG 24 hr capsule Commonly known as: EFFEXOR-XR Take 1 capsule (150 mg total) by mouth daily with breakfast. What changed: additional instructions Changed by: Fransisca Kaufmann Breeana Sawtelle, MD        Objective:   BP 136/78   Pulse (!) 58   Ht '6\' 1"'  (1.854 m)   Wt 221 lb (100.2 kg)   SpO2 98%   BMI 29.16 kg/m   Wt Readings from Last 3 Encounters:  11/07/20 221 lb (100.2 kg)  05/01/20 209 lb (94.8 kg)  01/29/20 190 lb (86.2 kg)    Physical Exam Vitals reviewed.  Constitutional:      General: He is not in acute distress.    Appearance: He is well-developed. He is not diaphoretic.  HENT:     Right Ear: External ear normal.     Left Ear: External ear normal.     Nose: Nose normal.     Mouth/Throat:     Pharynx: No oropharyngeal exudate.  Eyes:  General: No scleral icterus.    Extraocular Movements: Extraocular movements intact.     Conjunctiva/sclera: Conjunctivae normal.     Pupils: Pupils are equal, round, and reactive to light.  Neck:     Thyroid: No thyromegaly.  Cardiovascular:     Rate and Rhythm: Normal rate and regular rhythm.     Heart sounds: Normal heart sounds. No murmur heard.   Pulmonary:     Effort: Pulmonary effort is normal. No respiratory distress.     Breath sounds: Normal breath sounds. No wheezing.  Abdominal:     General: Bowel sounds are normal. There is no distension.     Palpations: Abdomen is soft.     Tenderness: There is no abdominal tenderness. There is no guarding or rebound.  Musculoskeletal:        General: Normal range of motion.     Cervical back: Neck supple.  Lymphadenopathy:     Cervical: No cervical  adenopathy.  Skin:    General: Skin is warm and dry.     Findings: No rash.  Neurological:     Mental Status: He is alert and oriented to person, place, and time.     Coordination: Coordination normal.  Psychiatric:        Behavior: Behavior normal.     Results for orders placed or performed in visit on 07/20/19  LDL Cholesterol, Direct  Result Value Ref Range   LDL Direct 150 (H) 0 - 99 mg/dL    Assessment & Plan:   Problem List Items Addressed This Visit      Cardiovascular and Mediastinum   Essential hypertension   Relevant Orders   CBC with Differential/Platelet   CMP14+EGFR     Other   Depression, recurrent (HCC)   Relevant Medications   venlafaxine XR (EFFEXOR-XR) 150 MG 24 hr capsule   Other Relevant Orders   CBC with Differential/Platelet   TSH   Hyperlipidemia   Relevant Orders   CBC with Differential/Platelet   NMR, lipoprofile    Other Visit Diagnoses    Well adult exam    -  Primary   Relevant Orders   CBC with Differential/Platelet   CMP14+EGFR   TSH   PSA, total and free   NMR, lipoprofile   Anxiety       Relevant Medications   venlafaxine XR (EFFEXOR-XR) 150 MG 24 hr capsule   Other Relevant Orders   CBC with Differential/Platelet   Prostate cancer screening       Relevant Orders   CBC with Differential/Platelet   PSA, total and free      Patient will come in and get fasting labs either tomorrow or next week.  Continue Effexor. Follow up plan: Return in about 1 year (around 11/07/2021), or if symptoms worsen or fail to improve, for Physical exam and recheck on anxiety and hypertension.  Counseling provided for all of the vaccine components Orders Placed This Encounter  Procedures  . CBC with Differential/Platelet  . CMP14+EGFR  . TSH  . PSA, total and free  . NMR, lipoprofile    Caryl Pina, MD [facility] 11/07/2020, 3:43 PM

## 2020-12-10 ENCOUNTER — Other Ambulatory Visit: Payer: Medicare HMO

## 2020-12-10 ENCOUNTER — Other Ambulatory Visit: Payer: Self-pay

## 2020-12-10 DIAGNOSIS — I10 Essential (primary) hypertension: Secondary | ICD-10-CM

## 2020-12-10 DIAGNOSIS — Z0000 Encounter for general adult medical examination without abnormal findings: Secondary | ICD-10-CM

## 2020-12-10 DIAGNOSIS — E7800 Pure hypercholesterolemia, unspecified: Secondary | ICD-10-CM | POA: Diagnosis not present

## 2020-12-10 DIAGNOSIS — Z125 Encounter for screening for malignant neoplasm of prostate: Secondary | ICD-10-CM

## 2020-12-11 LAB — CMP14+EGFR
ALT: 16 IU/L (ref 0–44)
AST: 16 IU/L (ref 0–40)
Albumin/Globulin Ratio: 1.8 (ref 1.2–2.2)
Albumin: 4.2 g/dL (ref 3.8–4.8)
Alkaline Phosphatase: 54 IU/L (ref 44–121)
BUN/Creatinine Ratio: 16 (ref 10–24)
BUN: 16 mg/dL (ref 8–27)
Bilirubin Total: 0.6 mg/dL (ref 0.0–1.2)
CO2: 22 mmol/L (ref 20–29)
Calcium: 8.8 mg/dL (ref 8.6–10.2)
Chloride: 104 mmol/L (ref 96–106)
Creatinine, Ser: 0.99 mg/dL (ref 0.76–1.27)
Globulin, Total: 2.3 g/dL (ref 1.5–4.5)
Glucose: 84 mg/dL (ref 65–99)
Potassium: 4.5 mmol/L (ref 3.5–5.2)
Sodium: 139 mmol/L (ref 134–144)
Total Protein: 6.5 g/dL (ref 6.0–8.5)
eGFR: 83 mL/min/{1.73_m2} (ref >59)

## 2020-12-11 LAB — NMR, LIPOPROFILE
Cholesterol, Total: 224 mg/dL — ABNORMAL HIGH (ref 100–199)
HDL Particle Number: 27 umol/L — ABNORMAL LOW (ref >=30.5)
HDL-C: 39 mg/dL — ABNORMAL LOW (ref >39)
LDL Particle Number: 2284 nmol/L — ABNORMAL HIGH (ref <1000)
LDL Size: 20.4 nm — ABNORMAL LOW (ref >20.5)
LDL-C (NIH Calc): 169 mg/dL — ABNORMAL HIGH (ref 0–99)
LP-IR Score: 56 — ABNORMAL HIGH (ref <=45)
Small LDL Particle Number: 1312 nmol/L — ABNORMAL HIGH (ref <=527)
Triglycerides: 88 mg/dL (ref 0–149)

## 2020-12-11 LAB — CBC WITH DIFFERENTIAL/PLATELET
Basophils Absolute: 0.1 10*3/uL (ref 0.0–0.2)
Basos: 1 %
EOS (ABSOLUTE): 0.2 10*3/uL (ref 0.0–0.4)
Eos: 4 %
Hematocrit: 42.6 % (ref 37.5–51.0)
Hemoglobin: 14.7 g/dL (ref 13.0–17.7)
Immature Grans (Abs): 0 10*3/uL (ref 0.0–0.1)
Immature Granulocytes: 0 %
Lymphocytes Absolute: 1.9 10*3/uL (ref 0.7–3.1)
Lymphs: 39 %
MCH: 33 pg (ref 26.6–33.0)
MCHC: 34.5 g/dL (ref 31.5–35.7)
MCV: 96 fL (ref 79–97)
Monocytes Absolute: 0.4 10*3/uL (ref 0.1–0.9)
Monocytes: 9 %
Neutrophils Absolute: 2.3 10*3/uL (ref 1.4–7.0)
Neutrophils: 47 %
Platelets: 268 10*3/uL (ref 150–450)
RBC: 4.45 x10E6/uL (ref 4.14–5.80)
RDW: 12.1 % (ref 11.6–15.4)
WBC: 4.8 10*3/uL (ref 3.4–10.8)

## 2020-12-11 LAB — PSA, TOTAL AND FREE
PSA, Free Pct: >10 %
PSA, Free: 0.01 ng/mL
Prostate Specific Ag, Serum: <0.1 ng/mL (ref 0.0–4.0)

## 2020-12-11 LAB — THYROID PANEL WITH TSH
Free Thyroxine Index: 1.5 (ref 1.2–4.9)
T3 Uptake Ratio: 27 % (ref 24–39)
T4, Total: 5.5 ug/dL (ref 4.5–12.0)
TSH: 2.26 u[IU]/mL (ref 0.450–4.500)

## 2021-05-05 ENCOUNTER — Ambulatory Visit (INDEPENDENT_AMBULATORY_CARE_PROVIDER_SITE_OTHER): Payer: Medicare HMO

## 2021-05-05 VITALS — Ht 73.0 in | Wt 221.0 lb

## 2021-05-05 DIAGNOSIS — Z0000 Encounter for general adult medical examination without abnormal findings: Secondary | ICD-10-CM

## 2021-05-05 NOTE — Patient Instructions (Signed)
Spencer White , Thank you for taking time to come for your Medicare Wellness Visit. I appreciate your ongoing commitment to your health goals. Please review the following plan we discussed and let me know if I can assist you in the future.   Screening recommendations/referrals: Colonoscopy: Done 05/24/2018 Repeat in 5 years  Recommended yearly ophthalmology/optometry visit for glaucoma screening and checkup Recommended yearly dental visit for hygiene and checkup  Vaccinations: Influenza vaccine: Due. Repeat annually  Pneumococcal vaccine: Done 05/17/2015 and 12/19/2017 Tdap vaccine: Done 12/14/2014 Repeat in 10 years  Shingles vaccine: Zoster done 06/17/2015 Shingrix discussed. Please contact your pharmacy for coverage information.     Covid-19: Done 07/20/2019 and 08/10/2019  Advanced directives: Please bring a copy of your health care power of attorney and living will to the office to be added to your chart at your convenience.   Conditions/risks identified: Aim for 30 minutes of exercise or brisk walking each day, drink 6-8 glasses of water and eat lots of fruits and vegetables. KEEP UP THE GOOD WORK!!!  Next appointment: Follow up in one year for your annual wellness visit. 2023.  Preventive Care 24 Years and Older, Male  Preventive care refers to lifestyle choices and visits with your health care provider that can promote health and wellness. What does preventive care include? A yearly physical exam. This is also called an annual well check. Dental exams once or twice a year. Routine eye exams. Ask your health care provider how often you should have your eyes checked. Personal lifestyle choices, including: Daily care of your teeth and gums. Regular physical activity. Eating a healthy diet. Avoiding tobacco and drug use. Limiting alcohol use. Practicing safe sex. Taking low doses of aspirin every day. Taking vitamin and mineral supplements as recommended by your health care  provider. What happens during an annual well check? The services and screenings done by your health care provider during your annual well check will depend on your age, overall health, lifestyle risk factors, and family history of disease. Counseling  Your health care provider may ask you questions about your: Alcohol use. Tobacco use. Drug use. Emotional well-being. Home and relationship well-being. Sexual activity. Eating habits. History of falls. Memory and ability to understand (cognition). Work and work Statistician. Screening  You may have the following tests or measurements: Height, weight, and BMI. Blood pressure. Lipid and cholesterol levels. These may be checked every 5 years, or more frequently if you are over 63 years old. Skin check. Lung cancer screening. You may have this screening every year starting at age 21 if you have a 30-pack-year history of smoking and currently smoke or have quit within the past 15 years. Fecal occult blood test (FOBT) of the stool. You may have this test every year starting at age 13. Flexible sigmoidoscopy or colonoscopy. You may have a sigmoidoscopy every 5 years or a colonoscopy every 10 years starting at age 7. Prostate cancer screening. Recommendations will vary depending on your family history and other risks. Hepatitis C blood test. Hepatitis B blood test. Sexually transmitted disease (STD) testing. Diabetes screening. This is done by checking your blood sugar (glucose) after you have not eaten for a while (fasting). You may have this done every 1-3 years. Abdominal aortic aneurysm (AAA) screening. You may need this if you are a current or former smoker. Osteoporosis. You may be screened starting at age 54 if you are at high risk. Talk with your health care provider about your test results, treatment options,  and if necessary, the need for more tests. Vaccines  Your health care provider may recommend certain vaccines, such  as: Influenza vaccine. This is recommended every year. Tetanus, diphtheria, and acellular pertussis (Tdap, Td) vaccine. You may need a Td booster every 10 years. Zoster vaccine. You may need this after age 75. Pneumococcal 13-valent conjugate (PCV13) vaccine. One dose is recommended after age 98. Pneumococcal polysaccharide (PPSV23) vaccine. One dose is recommended after age 60. Talk to your health care provider about which screenings and vaccines you need and how often you need them. This information is not intended to replace advice given to you by your health care provider. Make sure you discuss any questions you have with your health care provider. Document Released: 07/12/2015 Document Revised: 03/04/2016 Document Reviewed: 04/16/2015 Elsevier Interactive Patient Education  2017 [place] Prevention in the Home Falls can cause injuries. They can happen to people of all ages. There are many things you can do to make your home safe and to help prevent falls. What can I do on the outside of my home? Regularly fix the edges of walkways and driveways and fix any cracks. Remove anything that might make you trip as you walk through a door, such as a raised step or threshold. Trim any bushes or trees on the path to your home. Use bright outdoor lighting. Clear any walking paths of anything that might make someone trip, such as rocks or tools. Regularly check to see if handrails are loose or broken. Make sure that both sides of any steps have handrails. Any raised decks and porches should have guardrails on the edges. Have any leaves, snow, or ice cleared regularly. Use sand or salt on walking paths during winter. Clean up any spills in your garage right away. This includes oil or grease spills. What can I do in the bathroom? Use night lights. Install grab bars by the toilet and in the tub and shower. Do not use towel bars as grab bars. Use non-skid mats or decals in the tub or  shower. If you need to sit down in the shower, use a plastic, non-slip stool. Keep the floor dry. Clean up any water that spills on the floor as soon as it happens. Remove soap buildup in the tub or shower regularly. Attach bath mats securely with double-sided non-slip rug tape. Do not have throw rugs and other things on the floor that can make you trip. What can I do in the bedroom? Use night lights. Make sure that you have a light by your bed that is easy to reach. Do not use any sheets or blankets that are too big for your bed. They should not hang down onto the floor. Have a firm chair that has side arms. You can use this for support while you get dressed. Do not have throw rugs and other things on the floor that can make you trip. What can I do in the kitchen? Clean up any spills right away. Avoid walking on wet floors. Keep items that you use a lot in easy-to-reach places. If you need to reach something above you, use a strong step stool that has a grab bar. Keep electrical cords out of the way. Do not use floor polish or wax that makes floors slippery. If you must use wax, use non-skid floor wax. Do not have throw rugs and other things on the floor that can make you trip. What can I do with my stairs? Do not leave  any items on the stairs. Make sure that there are handrails on both sides of the stairs and use them. Fix handrails that are broken or loose. Make sure that handrails are as long as the stairways. Check any carpeting to make sure that it is firmly attached to the stairs. Fix any carpet that is loose or worn. Avoid having throw rugs at the top or bottom of the stairs. If you do have throw rugs, attach them to the floor with carpet tape. Make sure that you have a light switch at the top of the stairs and the bottom of the stairs. If you do not have them, ask someone to add them for you. What else can I do to help prevent falls? Wear shoes that: Do not have high heels. Have  rubber bottoms. Are comfortable and fit you well. Are closed at the toe. Do not wear sandals. If you use a stepladder: Make sure that it is fully opened. Do not climb a closed stepladder. Make sure that both sides of the stepladder are locked into place. Ask someone to hold it for you, if possible. Clearly mark and make sure that you can see: Any grab bars or handrails. First and last steps. Where the edge of each step is. Use tools that help you move around (mobility aids) if they are needed. These include: Canes. Walkers. Scooters. Crutches. Turn on the lights when you go into a dark area. Replace any light bulbs as soon as they burn out. Set up your furniture so you have a clear path. Avoid moving your furniture around. If any of your floors are uneven, fix them. If there are any pets around you, be aware of where they are. Review your medicines with your doctor. Some medicines can make you feel dizzy. This can increase your chance of falling. Ask your doctor what other things that you can do to help prevent falls. This information is not intended to replace advice given to you by your health care provider. Make sure you discuss any questions you have with your health care provider. Document Released: 04/11/2009 Document Revised: 11/21/2015 Document Reviewed: 07/20/2014 Elsevier Interactive Patient Education  2017 [employer].

## 2021-05-05 NOTE — Progress Notes (Signed)
Subjective:   Spencer White is a 70 y.o. male who presents for an Initial Medicare Annual Wellness Visit. Virtual Visit via Telephone Note  I connected with  Spencer White on 05/05/21 at 10:30 AM EST by telephone and verified that I am speaking with the correct person using two identifiers.  Location: Patient: Home Provider: [facility] Persons participating in the virtual visit: patient/Nurse Health Advisor   I discussed the limitations, risks, security and privacy concerns of performing an evaluation and management service by telephone and the availability of in person appointments. The patient expressed understanding and agreed to proceed.  Interactive audio and video telecommunications were attempted between this nurse and patient, however failed, due to patient having technical difficulties OR patient did not have access to video capability.  We continued and completed visit with audio only.  Some vital signs may be absent or patient reported.   Chriss Driver, LPN  Review of Systems     Cardiac Risk Factors include: advanced age (>46men, >64 women);dyslipidemia;hypertension;male gender;sedentary lifestyle     Objective:    Today's Vitals   05/05/21 1031  Weight: 221 lb (100.2 kg)  Height: 6\' 1"  (1.854 m)   Body mass index is 29.16 kg/m.  Advanced Directives 05/05/2021 12/24/2017 12/14/2014 10/24/2014  Does Patient Have a Medical Advance Directive? Yes Yes Yes No;Yes  Type of Paramedic of [place];Living will [place];Living will [place] in Chart? No - copy requested No - copy requested No - copy requested -  Would patient like information on creating a medical advance directive? No - Patient declined - - -    Current Medications (verified) Outpatient Encounter Medications as of 05/05/2021  Medication Sig   Multiple Vitamin (MULTIVITAMIN  WITH MINERALS) TABS tablet Take 1 tablet by mouth daily.   timolol (TIMOPTIC) 0.25 % ophthalmic solution Place 1 drop into both eyes 2 (two) times daily.   venlafaxine XR (EFFEXOR-XR) 150 MG 24 hr capsule Take 1 capsule (150 mg total) by mouth daily with breakfast. (Patient not taking: Reported on 05/05/2021)   No facility-administered encounter medications on file as of 05/05/2021.    Allergies (verified) Latex   History: Past Medical History:  Diagnosis Date   Allergy    seasonal allergies   Asthma    as a child   BCC (basal cell carcinoma), scalp/neck    Depression    Diverticulosis    Duodenitis    Hiatal hernia    Hyperlipidemia    Prostate cancer ([place]) 2011   Schatzki's ring    Status post dilation of esophageal narrowing    Tubular adenoma of colon    Past Surgical History:  Procedure Laterality Date   COLONOSCOPY     PROSTATECTOMY  07/2009   TONSILLECTOMY     Family History  Problem Relation Age of Onset   Lung cancer Mother    Lung cancer Brother    Heart disease Father 20       Angioplasty stent   Hypertension Father    AAA (abdominal aortic aneurysm) Father    Cancer Maternal Grandmother        bladder   Asthma Paternal Grandmother    Colon cancer Neg Hx    Colon polyps Neg Hx    Kidney disease Neg Hx    Gallbladder disease Neg Hx    Esophageal cancer Neg Hx  Diabetes Neg Hx    Stomach cancer Neg Hx    Rectal cancer Neg Hx    Social History   Socioeconomic History   Marital status: Divorced    Spouse name: Not on file   Number of children: 0   Years of education: Not on file   Highest education level: Not on file  Occupational History   Occupation: Environmental manager  Tobacco Use   Smoking status: Never   Smokeless tobacco: Never  Vaping Use   Vaping Use: Never used  Substance and Sexual Activity   Alcohol use: Yes    Alcohol/week: 0.0 standard drinks    Comment: 1 glass red wine 1-2 a week   Drug use: No   Sexual activity: Not  on file  Other Topics Concern   Not on file  Social History Narrative   Lives alone but has 6 cats and a dog.   Supportive brother and sister in law live close by.   Social Determinants of Health   Financial Resource Strain: Low Risk    Difficulty of Paying Living Expenses: Not very hard  Food Insecurity: No Food Insecurity   Worried About Charity fundraiser in the Last Year: Never true   Ran Out of Food in the Last Year: Never true  Transportation Needs: No Transportation Needs   Lack of Transportation (Medical): No   Lack of Transportation (Non-Medical): No  Physical Activity: Sufficiently Active   Days of Exercise per Week: 5 days   Minutes of Exercise per Session: 30 min  Stress: No Stress Concern Present   Feeling of Stress : Not at all  Social Connections: Moderately Integrated   Frequency of Communication with Friends and Family: More than three times a week   Frequency of Social Gatherings with Friends and Family: Once a week   Attends Religious Services: More than 4 times per year   Active Member of [employer] or Organizations: Yes   Attends Music therapist: More than 4 times per year   Marital Status: Divorced    Tobacco Counseling Counseling given: Not Answered   Clinical Intake:  Pre-visit preparation completed: Yes  Pain : No/denies pain     BMI - recorded: 29.16 Nutritional Status: BMI 25 -29 Overweight Nutritional Risks: None Diabetes: No  How often do you need to have someone help you when you read instructions, pamphlets, or other written materials from your doctor or pharmacy?: 1 - Never  Diabetic?No  Interpreter Needed?: No  Information entered by :: MJ Glorene Leitzke, LPN   Activities of Daily Living In your present state of health, do you have any difficulty performing the following activities: 05/05/2021  Hearing? N  Vision? N  Difficulty concentrating or making decisions? N  Walking or climbing stairs? N  Dressing or bathing? N   Doing errands, shopping? N  Preparing Food and eating ? N  Using the Toilet? N  In the past six months, have you accidently leaked urine? N  Do you have problems with loss of bowel control? N  Managing your Medications? N  Managing your Finances? N  Housekeeping or managing your Housekeeping? N  Some recent data might be hidden    Patient Care Team: Dettinger, Fransisca Kaufmann, MD as PCP - General (Family Medicine)  Indicate any recent Medical Services you may have received from other than [facility] providers in the past year (date may be approximate).     Assessment:   This is a routine wellness  examination for [facility].  Hearing/Vision screen Hearing Screening - Comments:: No hearing issues.  Vision Screening - Comments:: Readers. Dr. Sabra Heck. 08/2020.  Dietary issues and exercise activities discussed: Current Exercise Habits: Home exercise routine;Structured exercise class, Type of exercise: walking;strength training/weights, Time (Minutes): 30, Frequency (Times/Week): 5, Weekly Exercise (Minutes/Week): 150, Intensity: Mild, Exercise limited by: cardiac condition(s)   Goals Addressed             This Visit's Progress    Exercise 3x per week (30 min per time)         Depression Screen PHQ 2/9 Scores 05/05/2021 11/07/2020 05/01/2020 01/29/2020 01/29/2020 12/29/2019 12/15/2019  PHQ - 2 Score 1 0 0 4 6 6 6   PHQ- 9 Score - - - - 24 - 24    Fall Risk Fall Risk  05/05/2021 11/07/2020 05/01/2020 01/29/2020 12/29/2019  Falls in the past year? 0 0 0 0 0  Number falls in past yr: 0 - - - -  Injury with Fall? 0 - - - -  Risk for fall due to : No Fall Risks - - - -  Follow up Falls prevention discussed - - - -    FALL RISK PREVENTION PERTAINING TO THE HOME:  Any stairs in or around the home? Yes  If so, are there any without handrails? No  Home free of loose throw rugs in walkways, pet beds, electrical cords, etc? No  Adequate lighting in your home to reduce risk of falls? No   ASSISTIVE DEVICES  UTILIZED TO PREVENT FALLS:  Life alert? No  Use of a cane, walker or w/c? No  Grab bars in the bathroom? No  Shower chair or bench in shower? No  Elevated toilet seat or a handicapped toilet? No   TIMED UP AND GO:  Was the test performed? No . Phone visit.    Cognitive Function: MMSE - Mini Mental State Exam 12/24/2017  Orientation to time 5  Orientation to Place 5  Registration 3  Attention/ Calculation 5  Recall 3  Language- name 2 objects 2  Language- repeat 1  Language- follow 3 step command 3  Language- read & follow direction 1  Write a sentence 1  Copy design 1  Total score 30     6CIT Screen 05/05/2021  What Year? 0 points  What month? 0 points  What time? 0 points  Count back from 20 0 points  Months in reverse 0 points  Repeat phrase 0 points  Total Score 0    Immunizations Immunization History  Administered Date(s) Administered   Fluad Quad(high Dose 65+) 05/16/2019, 05/01/2020   Influenza, High Dose Seasonal PF 07/07/2018   Influenza,inj,Quad PF,6+ Mos 04/24/2013, 07/13/2014, 05/17/2015, 06/26/2016, 05/06/2017   Influenza-Unspecified 07/17/2009, 03/27/2010, 05/14/2011, 06/28/2012   PFIZER(Purple Top)SARS-COV-2 Vaccination 07/20/2019, 08/10/2019   Pneumococcal Conjugate-13 05/17/2015   Pneumococcal Polysaccharide-23 11/29/2017   Tdap 12/14/2014   Zoster, Live 06/17/2015    TDAP status: Up to date  Flu Vaccine status: Due, Education has been provided regarding the importance of this vaccine. Advised may receive this vaccine at local pharmacy or Health Dept. Aware to provide a copy of the vaccination record if obtained from local pharmacy or Health Dept. Verbalized acceptance and understanding.  Pneumococcal vaccine status: Up to date  Covid-19 vaccine status: Information provided on how to obtain vaccines.   Qualifies for Shingles Vaccine? Yes   Zostavax completed Yes   Shingrix Completed?: No.    Education has been provided regarding the  importance of  this vaccine. Patient has been advised to call insurance company to determine out of pocket expense if they have not yet received this vaccine. Advised may also receive vaccine at local pharmacy or Health Dept. Verbalized acceptance and understanding.  Screening Tests Health Maintenance  Topic Date Due   Zoster Vaccines- Shingrix (1 of 2) Never done   COVID-19 Vaccine (3 - Pfizer risk series) 09/07/2019   INFLUENZA VACCINE  01/27/2021   COLONOSCOPY (Pts 45-46yrs Insurance coverage will need to be confirmed)  05/25/2023   TETANUS/TDAP  12/13/2024   Pneumonia Vaccine 45+ Years old  Completed   Hepatitis C Screening  Completed   HPV VACCINES  Aged Out    Health Maintenance  Health Maintenance Due  Topic Date Due   Zoster Vaccines- Shingrix (1 of 2) Never done   COVID-19 Vaccine (3 - Pfizer risk series) 09/07/2019   INFLUENZA VACCINE  01/27/2021    Colorectal cancer screening: Type of screening: Colonoscopy. Completed 05/25/2023. Repeat every 5 years  Lung Cancer Screening: (Low Dose CT Chest recommended if Age 59-80 years, 30 pack-year currently smoking OR have quit w/in 15years.) does not qualify.    Additional Screening:  Hepatitis C Screening: does qualify; Completed 05/22/2020  Vision Screening: Recommended annual ophthalmology exams for early detection of glaucoma and other disorders of the eye. Is the patient up to date with their annual eye exam?  Yes  Who is the provider or what is the name of the office in which the patient attends annual eye exams? Dr. Sabra Heck in [place] If pt is not established with a provider, would they like to be referred to a provider to establish care? No .   Dental Screening: Recommended annual dental exams for proper oral hygiene  Community Resource Referral / Chronic Care Management: CRR required this visit?  No   CCM required this visit?  No      Plan:     I have personally reviewed and noted the following in the  patient's chart:   Medical and social history Use of alcohol, tobacco or illicit drugs  Current medications and supplements including opioid prescriptions. Patient is not currently taking opioid prescriptions. Functional ability and status Nutritional status Physical activity Advanced directives List of other physicians Hospitalizations, surgeries, and ER visits in previous 12 months Vitals Screenings to include cognitive, depression, and falls Referrals and appointments  In addition, I have reviewed and discussed with patient certain preventive protocols, quality metrics, and best practice recommendations. A written personalized care plan for preventive services as well as general preventive health recommendations were provided to patient.     Chriss Driver, LPN   37/06/624   Nurse Notes: Up to date on health maintenance. Flu and Shingrix due. Colonoscopy due 05/25/2023.

## 2021-05-15 ENCOUNTER — Other Ambulatory Visit: Payer: Self-pay

## 2021-05-15 ENCOUNTER — Ambulatory Visit (INDEPENDENT_AMBULATORY_CARE_PROVIDER_SITE_OTHER): Payer: Medicare HMO

## 2021-05-15 DIAGNOSIS — Z23 Encounter for immunization: Secondary | ICD-10-CM

## 2021-05-27 DIAGNOSIS — H401111 Primary open-angle glaucoma, right eye, mild stage: Secondary | ICD-10-CM | POA: Diagnosis not present

## 2021-05-27 DIAGNOSIS — H524 Presbyopia: Secondary | ICD-10-CM | POA: Diagnosis not present

## 2021-05-27 DIAGNOSIS — H52223 Regular astigmatism, bilateral: Secondary | ICD-10-CM | POA: Diagnosis not present

## 2021-05-27 DIAGNOSIS — H401121 Primary open-angle glaucoma, left eye, mild stage: Secondary | ICD-10-CM | POA: Diagnosis not present

## 2021-05-27 DIAGNOSIS — H5203 Hypermetropia, bilateral: Secondary | ICD-10-CM | POA: Diagnosis not present

## 2021-07-25 DIAGNOSIS — H401121 Primary open-angle glaucoma, left eye, mild stage: Secondary | ICD-10-CM | POA: Diagnosis not present

## 2021-07-25 DIAGNOSIS — H40011 Open angle with borderline findings, low risk, right eye: Secondary | ICD-10-CM | POA: Diagnosis not present

## 2021-10-15 DIAGNOSIS — D225 Melanocytic nevi of trunk: Secondary | ICD-10-CM | POA: Diagnosis not present

## 2021-10-15 DIAGNOSIS — C44612 Basal cell carcinoma of skin of right upper limb, including shoulder: Secondary | ICD-10-CM | POA: Diagnosis not present

## 2021-10-15 DIAGNOSIS — B0089 Other herpesviral infection: Secondary | ICD-10-CM | POA: Diagnosis not present

## 2021-10-15 DIAGNOSIS — C4441 Basal cell carcinoma of skin of scalp and neck: Secondary | ICD-10-CM | POA: Diagnosis not present

## 2021-11-12 DIAGNOSIS — Z85828 Personal history of other malignant neoplasm of skin: Secondary | ICD-10-CM | POA: Diagnosis not present

## 2021-11-12 DIAGNOSIS — Z08 Encounter for follow-up examination after completed treatment for malignant neoplasm: Secondary | ICD-10-CM | POA: Diagnosis not present

## 2021-11-12 DIAGNOSIS — T63441A Toxic effect of venom of bees, accidental (unintentional), initial encounter: Secondary | ICD-10-CM | POA: Diagnosis not present

## 2021-11-19 DIAGNOSIS — C4441 Basal cell carcinoma of skin of scalp and neck: Secondary | ICD-10-CM | POA: Diagnosis not present

## 2021-12-09 ENCOUNTER — Other Ambulatory Visit: Payer: Self-pay | Admitting: Family Medicine

## 2021-12-09 DIAGNOSIS — F419 Anxiety disorder, unspecified: Secondary | ICD-10-CM

## 2021-12-09 DIAGNOSIS — F339 Major depressive disorder, recurrent, unspecified: Secondary | ICD-10-CM

## 2021-12-12 ENCOUNTER — Telehealth: Payer: Self-pay | Admitting: Family Medicine

## 2021-12-12 NOTE — Telephone Encounter (Signed)
Last OV 0512/2022. Last RF 11/07/2020. Next OV 01/30/2022

## 2021-12-12 NOTE — Telephone Encounter (Signed)
Appointment scheduled.

## 2021-12-12 NOTE — Telephone Encounter (Signed)
He will need an appt to restart this medication.

## 2021-12-12 NOTE — Telephone Encounter (Signed)
  Prescription Request  12/12/2021  Is this a "Controlled Substance" medicine? no  Have you seen your PCP in the last 2 weeks? No, appt made for first available  8/4  If YES, route message to pool  -  If NO, patient needs to be scheduled for appointment.  What is the name of the medication or equipment? venlafaxine XR (EFFEXOR-XR) 150 MG 24 hr capsule  Have you contacted your pharmacy to request a refill? yes   Which pharmacy would you like this sent to? [facility]    Patient notified that their request is being sent to the clinical staff for review and that they should receive a response within 2 business days.

## 2021-12-15 ENCOUNTER — Encounter: Payer: Self-pay | Admitting: Nurse Practitioner

## 2021-12-15 ENCOUNTER — Ambulatory Visit (INDEPENDENT_AMBULATORY_CARE_PROVIDER_SITE_OTHER): Payer: Medicare HMO | Admitting: Nurse Practitioner

## 2021-12-15 DIAGNOSIS — F339 Major depressive disorder, recurrent, unspecified: Secondary | ICD-10-CM

## 2021-12-15 DIAGNOSIS — R69 Illness, unspecified: Secondary | ICD-10-CM | POA: Diagnosis not present

## 2021-12-15 DIAGNOSIS — F419 Anxiety disorder, unspecified: Secondary | ICD-10-CM | POA: Diagnosis not present

## 2021-12-15 MED ORDER — VENLAFAXINE HCL ER 150 MG PO CP24: 150.0000 mg | ORAL_CAPSULE | Freq: Every day | ORAL | 3 refills | Status: DC

## 2021-12-15 NOTE — Progress Notes (Signed)
Established Patient Office Visit  Subjective   Patient ID: Spencer White, male    DOB: 24-May-1951  Age: 71 y.o. MRN: [id number]  Chief Complaint  Patient presents with   Medication Refill    Pt states everything is staying the same , no issues     HPI Depression, Follow-up  He  was last seen for this 1 years ago. Changes made at last visit include Effexor 150 mg tablet by mouth daily.   He reports good compliance with treatment. He is not having side effects.   He reports good tolerance of treatment. Current symptoms include: depressed mood He feels he is Improved since last visit.     12/15/2021    2:53 PM 05/05/2021   10:41 AM 11/07/2020    2:56 PM  Depression screen PHQ 2/9  Decreased Interest 0 1 0  Down, Depressed, Hopeless 0 0 0  PHQ - 2 Score 0 1 0   .hpian  Patient Active Problem List   Diagnosis Date Noted   BMI 28.0-28.9,adult 06/05/2019   Pulmonary nodules 11/29/2017   Family history of lung cancer 11/29/2017   Elevated coronary artery calcium score 11/10/2016   Essential hypertension 11/10/2016   Malignant neoplasm of prostate ([place]) 03/28/2014   Organic impotence 03/28/2014   GAD (generalized anxiety disorder) 03/28/2014   History of prostate cancer 07/12/2013   Depression, recurrent ([place]) 07/12/2013   Hyperlipidemia 07/12/2013   Blood pressure elevated without history of HTN 07/12/2013   Past Medical History:  Diagnosis Date   Allergy    seasonal allergies   Asthma    as a child   BCC (basal cell carcinoma), scalp/neck    Depression    Diverticulosis    Duodenitis    Hiatal hernia    Hyperlipidemia    Prostate cancer ([place]) 2011   Schatzki's ring    Status post dilation of esophageal narrowing    Tubular adenoma of colon    Past Surgical History:  Procedure Laterality Date   COLONOSCOPY     PROSTATECTOMY  07/2009   TONSILLECTOMY     Social History   Tobacco Use   Smoking status: Never   Smokeless tobacco: Never  Vaping Use    Vaping Use: Never used  Substance Use Topics   Alcohol use: Yes    Alcohol/week: 0.0 standard drinks of alcohol    Comment: 1 glass red wine 1-2 a week   Drug use: No   Social History   Socioeconomic History   Marital status: Divorced    Spouse name: Not on file   Number of children: 0   Years of education: Not on file   Highest education level: Not on file  Occupational History   Occupation: Environmental manager  Tobacco Use   Smoking status: Never   Smokeless tobacco: Never  Vaping Use   Vaping Use: Never used  Substance and Sexual Activity   Alcohol use: Yes    Alcohol/week: 0.0 standard drinks of alcohol    Comment: 1 glass red wine 1-2 a week   Drug use: No   Sexual activity: Not on file  Other Topics Concern   Not on file  Social History Narrative   Lives alone but has 6 cats and a dog.   Supportive brother and sister in law live close by.   Social Determinants of Health   Financial Resource Strain: Low Risk  (05/05/2021)   Overall Financial Resource Strain (CARDIA)    Difficulty of  Paying Living Expenses: Not very hard  Food Insecurity: No Food Insecurity (05/05/2021)   Hunger Vital Sign    Worried About Running Out of Food in the Last Year: Never true    Ran Out of Food in the Last Year: Never true  Transportation Needs: No Transportation Needs (05/05/2021)   PRAPARE - Hydrologist (Medical): No    Lack of Transportation (Non-Medical): No  Physical Activity: Sufficiently Active (05/05/2021)   Exercise Vital Sign    Days of Exercise per Week: 5 days    Minutes of Exercise per Session: 30 min  Stress: No Stress Concern Present (05/05/2021)   [place]    Feeling of Stress : Not at all  Social Connections: Moderately Integrated (05/05/2021)   Social Connection and Isolation Panel [NHANES]    Frequency of Communication with Friends and Family: More than three times a  week    Frequency of Social Gatherings with Friends and Family: Once a week    Attends Religious Services: More than 4 times per year    Active Member of [employer] or Organizations: Yes    Attends Music therapist: More than 4 times per year    Marital Status: Divorced  Intimate Partner Violence: Not At Risk (05/05/2021)   Humiliation, Afraid, Rape, and Kick questionnaire    Fear of Current or Ex-Partner: No    Emotionally Abused: No    Physically Abused: No    Sexually Abused: No   Family Status  Relation Name Status   Mother  Deceased   Brother  Alive   Father  Deceased   MGM  Deceased   MGF  Deceased   PGM  Deceased   PGF  Deceased at age 2   Neg Hx  (Not Specified)   Family History  Problem Relation Age of Onset   Lung cancer Mother    Lung cancer Brother    Heart disease Father 23       Angioplasty stent   Hypertension Father    AAA (abdominal aortic aneurysm) Father    Cancer Maternal Grandmother        bladder   Asthma Paternal Grandmother    Colon cancer Neg Hx    Colon polyps Neg Hx    Kidney disease Neg Hx    Gallbladder disease Neg Hx    Esophageal cancer Neg Hx    Diabetes Neg Hx    Stomach cancer Neg Hx    Rectal cancer Neg Hx    Allergies  Allergen Reactions   Latex Other (See Comments)    Gets redness from band-aids      Review of Systems  Constitutional: Negative.   HENT: Negative.    Eyes: Negative.   Respiratory: Negative.    Cardiovascular: Negative.   Genitourinary: Negative.   Musculoskeletal: Negative.   Skin: Negative.  Negative for rash.  Psychiatric/Behavioral:  Negative for depression.   All other systems reviewed and are negative.     Objective:     BP (!) 157/85   Pulse 67   Ht '6\' 1"'  (1.854 m)   Wt 228 lb 12.8 oz (103.8 kg)   SpO2 98%   BMI 30.19 kg/m  BP Readings from Last 3 Encounters:  12/15/21 (!) 157/85  11/07/20 136/78  05/01/20 119/73   Wt Readings from Last 3 Encounters:  12/15/21 228 lb 12.8  oz (103.8 kg)  05/05/21 221 lb (100.2 kg)  11/07/20 221 lb (100.2 kg)      Physical Exam Vitals and nursing note reviewed.  Constitutional:      Appearance: Normal appearance.  HENT:     Head: Normocephalic.     Right Ear: External ear normal.     Left Ear: External ear normal.     Nose: Nose normal.     Mouth/Throat:     Mouth: Mucous membranes are moist.     Pharynx: Oropharynx is clear.  Eyes:     Conjunctiva/sclera: Conjunctivae normal.  Cardiovascular:     Rate and Rhythm: Normal rate and regular rhythm.     Pulses: Normal pulses.     Heart sounds: Normal heart sounds.  Pulmonary:     Effort: Pulmonary effort is normal.     Breath sounds: Normal breath sounds.  Abdominal:     General: Bowel sounds are normal.  Skin:    General: Skin is warm.     Findings: No rash.  Neurological:     General: No focal deficit present.     Mental Status: He is alert and oriented to person, place, and time.  Psychiatric:        Mood and Affect: Mood normal.        Behavior: Behavior normal.      No results found for any visits on 12/15/21.  Last CBC Lab Results  Component Value Date   WBC 4.8 12/10/2020   HGB 14.7 12/10/2020   HCT 42.6 12/10/2020   MCV 96 12/10/2020   MCH 33.0 12/10/2020   RDW 12.1 12/10/2020   PLT 268 16/03/9603   Last metabolic panel Lab Results  Component Value Date   GLUCOSE 84 12/10/2020   NA 139 12/10/2020   K 4.5 12/10/2020   CL 104 12/10/2020   CO2 22 12/10/2020   BUN 16 12/10/2020   CREATININE 0.99 12/10/2020   EGFR 83 12/10/2020   CALCIUM 8.8 12/10/2020   PROT 6.5 12/10/2020   ALBUMIN 4.2 12/10/2020   LABGLOB 2.3 12/10/2020   AGRATIO 1.8 12/10/2020   BILITOT 0.6 12/10/2020   ALKPHOS 54 12/10/2020   AST 16 12/10/2020   ALT 16 12/10/2020   ANIONGAP 7 12/14/2014   Last lipids Lab Results  Component Value Date   CHOL 206 (H) 07/20/2019   HDL 42 07/20/2019   LDLCALC 150 (H) 07/20/2019   LDLDIRECT 150 (H) 07/20/2019   TRIG 75  07/20/2019   CHOLHDL 4.9 07/20/2019   Last hemoglobin A1c No results found for: "HGBA1C" Last thyroid functions Lab Results  Component Value Date   TSH 2.260 12/10/2020   T4TOTAL 5.5 12/10/2020      The 10-year ASCVD risk score (Arnett DK, et al., 2019) is: 30.6%    Assessment & Plan:   Problem List Items Addressed This Visit       Other   Depression, recurrent ([place])    Depression well controlled on current medication no changes needed. Completed phq-9, patient will like to talk to PCP about tapering off medication.  refill sent to pharmacy      Relevant Medications   venlafaxine XR (EFFEXOR-XR) 150 MG 24 hr capsule   Other Visit Diagnoses     Anxiety       Relevant Medications   venlafaxine XR (EFFEXOR-XR) 150 MG 24 hr capsule       Return if symptoms worsen or fail to improve.    Ivy Lynn, NP

## 2021-12-15 NOTE — Assessment & Plan Note (Addendum)
Depression well controlled on current medication no changes needed. Completed phq-9, patient will like to talk to PCP about tapering off medication.  refill sent to pharmacy

## 2021-12-15 NOTE — Patient Instructions (Signed)

## 2021-12-29 DIAGNOSIS — H52223 Regular astigmatism, bilateral: Secondary | ICD-10-CM | POA: Diagnosis not present

## 2021-12-29 DIAGNOSIS — H401131 Primary open-angle glaucoma, bilateral, mild stage: Secondary | ICD-10-CM | POA: Diagnosis not present

## 2021-12-29 DIAGNOSIS — H5203 Hypermetropia, bilateral: Secondary | ICD-10-CM | POA: Diagnosis not present

## 2021-12-29 DIAGNOSIS — H524 Presbyopia: Secondary | ICD-10-CM | POA: Diagnosis not present

## 2022-01-20 DIAGNOSIS — C4441 Basal cell carcinoma of skin of scalp and neck: Secondary | ICD-10-CM | POA: Diagnosis not present

## 2022-01-20 DIAGNOSIS — D485 Neoplasm of uncertain behavior of skin: Secondary | ICD-10-CM | POA: Diagnosis not present

## 2022-01-30 ENCOUNTER — Ambulatory Visit: Payer: Medicare HMO | Admitting: Family Medicine

## 2022-03-06 DIAGNOSIS — H524 Presbyopia: Secondary | ICD-10-CM | POA: Diagnosis not present

## 2022-06-05 DIAGNOSIS — H52223 Regular astigmatism, bilateral: Secondary | ICD-10-CM | POA: Diagnosis not present

## 2022-06-05 DIAGNOSIS — H401131 Primary open-angle glaucoma, bilateral, mild stage: Secondary | ICD-10-CM | POA: Diagnosis not present

## 2022-06-05 DIAGNOSIS — H524 Presbyopia: Secondary | ICD-10-CM | POA: Diagnosis not present

## 2022-06-05 DIAGNOSIS — H5203 Hypermetropia, bilateral: Secondary | ICD-10-CM | POA: Diagnosis not present

## 2022-06-09 ENCOUNTER — Ambulatory Visit (INDEPENDENT_AMBULATORY_CARE_PROVIDER_SITE_OTHER): Payer: Medicare HMO

## 2022-06-09 VITALS — Ht 73.0 in | Wt 215.0 lb

## 2022-06-09 DIAGNOSIS — Z0000 Encounter for general adult medical examination without abnormal findings: Secondary | ICD-10-CM | POA: Diagnosis not present

## 2022-06-09 DIAGNOSIS — Z23 Encounter for immunization: Secondary | ICD-10-CM

## 2022-06-09 NOTE — Progress Notes (Signed)
Subjective:   Spencer CIANCI is a 71 y.o. male who presents for Medicare Annual/Subsequent preventive examination. I connected with  [name] on 06/09/22 by a audio enabled telemedicine application and verified that I am speaking with the correct person using two identifiers.  Patient Location: Home  Provider Location: Home Office  I discussed the limitations of evaluation and management by telemedicine. The patient expressed understanding and agreed to proceed.  Review of Systems           Objective:    Today's Vitals   06/09/22 1342  Weight: 215 lb (97.5 kg)  Height: '6\' 1"'$  (1.854 m)   Body mass index is 28.37 kg/m.     06/09/2022    1:47 PM 05/05/2021   10:50 AM 12/24/2017    3:25 PM 12/14/2014    8:12 AM 10/24/2014    1:31 PM  Advanced Directives  Does Patient Have a Medical Advance Directive? Yes Yes Yes Yes No;Yes  Type of Paramedic of [place];Living will Healthcare Power of [place];Living will [place];Living will [place] in Chart? No - copy requested No - copy requested No - copy requested No - copy requested   Would patient like information on creating a medical advance directive?  No - Patient declined       Current Medications (verified) Outpatient Encounter Medications as of 06/09/2022  Medication Sig   Multiple Vitamin (MULTIVITAMIN WITH MINERALS) TABS tablet Take 1 tablet by mouth daily.   timolol (TIMOPTIC) 0.25 % ophthalmic solution Place 1 drop into both eyes 2 (two) times daily.   venlafaxine XR (EFFEXOR-XR) 150 MG 24 hr capsule Take 1 capsule (150 mg total) by mouth daily with breakfast.   No facility-administered encounter medications on file as of 06/09/2022.    Allergies (verified) Latex   History: Past Medical History:  Diagnosis Date   Allergy    seasonal allergies   Asthma    as a child   BCC  (basal cell carcinoma), scalp/neck    Depression    Diverticulosis    Duodenitis    Hiatal hernia    Hyperlipidemia    Prostate cancer ([place]) 2011   Schatzki's ring    Status post dilation of esophageal narrowing    Tubular adenoma of colon    Past Surgical History:  Procedure Laterality Date   COLONOSCOPY     PROSTATECTOMY  07/2009   TONSILLECTOMY     Family History  Problem Relation Age of Onset   Lung cancer Mother    Lung cancer Brother    Heart disease Father 31       Angioplasty stent   Hypertension Father    AAA (abdominal aortic aneurysm) Father    Cancer Maternal Grandmother        bladder   Asthma Paternal Grandmother    Colon cancer Neg Hx    Colon polyps Neg Hx    Kidney disease Neg Hx    Gallbladder disease Neg Hx    Esophageal cancer Neg Hx    Diabetes Neg Hx    Stomach cancer Neg Hx    Rectal cancer Neg Hx    Social History   Socioeconomic History   Marital status: Divorced    Spouse name: Not on file   Number of children: 0   Years of education: Not on file   Highest education level: Not on file  Occupational History   Occupation: Environmental manager  Tobacco Use   Smoking status: Never   Smokeless tobacco: Never  Vaping Use   Vaping Use: Never used  Substance and Sexual Activity   Alcohol use: Yes    Alcohol/week: 0.0 standard drinks of alcohol    Comment: 1 glass red wine 1-2 a week   Drug use: No   Sexual activity: Not on file  Other Topics Concern   Not on file  Social History Narrative   Lives alone but has 6 cats and a dog.   Supportive brother and sister in law live close by.   Social Determinants of Health   Financial Resource Strain: Low Risk  (06/09/2022)   Overall Financial Resource Strain (CARDIA)    Difficulty of Paying Living Expenses: Not hard at all  Food Insecurity: No Food Insecurity (06/09/2022)   Hunger Vital Sign    Worried About Running Out of Food in the Last Year: Never true    Ran Out of Food in the  Last Year: Never true  Transportation Needs: No Transportation Needs (06/09/2022)   PRAPARE - Hydrologist (Medical): No    Lack of Transportation (Non-Medical): No  Physical Activity: Sufficiently Active (05/05/2021)   Exercise Vital Sign    Days of Exercise per Week: 5 days    Minutes of Exercise per Session: 30 min  Stress: No Stress Concern Present (06/09/2022)   [place]    Feeling of Stress : Not at all  Social Connections: Moderately Integrated (06/09/2022)   Social Connection and Isolation Panel [NHANES]    Frequency of Communication with Friends and Family: More than three times a week    Frequency of Social Gatherings with Friends and Family: More than three times a week    Attends Religious Services: More than 4 times per year    Active Member of [employer] or Organizations: Yes    Attends Music therapist: More than 4 times per year    Marital Status: Divorced    Tobacco Counseling Counseling given: Not Answered   Clinical Intake:  Pre-visit preparation completed: Yes  Pain : No/denies pain     Nutritional Risks: None Diabetes: No  How often do you need to have someone help you when you read instructions, pamphlets, or other written materials from your doctor or pharmacy?: 1 - Never  Diabetic?no   Interpreter Needed?: No  Information entered by :: Jadene Pierini, LPN   Activities of Daily Living    06/05/2022    6:30 PM  In your present state of health, do you have any difficulty performing the following activities:  Hearing? 0   0   0  Vision? 0   0   0  Difficulty concentrating or making decisions? 0   0   0  Walking or climbing stairs? 0   0   0  Dressing or bathing? 0   0   0  Doing errands, shopping? 0   0   0  Preparing Food and eating ? N   N   N  Using the Toilet? N   N   N  In the past six months, have you accidently leaked  urine? N   N   N  Do you have problems with loss of bowel control? N   N   N  Managing your Medications? N   N  N  Managing your Finances? N   N   N  Housekeeping or managing your Housekeeping? Carloyn Manner    Patient Care Team: Dettinger, Fransisca Kaufmann, MD as PCP - General (Family Medicine)  Indicate any recent Medical Services you may have received from other than [facility] providers in the past year (date may be approximate).     Assessment:   This is a routine wellness examination for [place].  Hearing/Vision screen Vision Screening - Comments:: Wears rx glasses - up to date with routine eye exams with  [email or web address]  Dietary issues and exercise activities discussed:     Goals Addressed             This Visit's Progress    Exercise 3x per week (30 min per time)   On track      Depression Screen    06/09/2022    1:46 PM 12/15/2021    2:53 PM 05/05/2021   10:41 AM 11/07/2020    2:56 PM 05/01/2020    3:30 PM 01/29/2020    4:31 PM 01/29/2020    4:24 PM  PHQ 2/9 Scores  PHQ - 2 Score 0 0 1 0 0 4 6  PHQ- 9 Score       24    Fall Risk    06/09/2022    1:43 PM 06/05/2022    6:30 PM 05/05/2021   10:51 AM 11/07/2020    2:56 PM 05/01/2020    3:30 PM  [place] in the past year? 0 0   0   0 0 0 0  Number falls in past yr: 0  0    Injury with Fall? 0  0    Risk for fall due to : No Fall Risks  No Fall Risks    Follow up Falls prevention discussed  Falls prevention discussed      FALL RISK PREVENTION PERTAINING TO THE HOME:  Any stairs in or around the home? Yes  If so, are there any without handrails? No  Home free of loose throw rugs in walkways, pet beds, electrical cords, etc? Yes  Adequate lighting in your home to reduce risk of falls? Yes   ASSISTIVE DEVICES UTILIZED TO PREVENT FALLS:  Life alert? No  Use of a cane, walker or w/c? No  Grab bars in the bathroom? No  Shower chair or bench in shower? No  Elevated toilet seat or a handicapped toilet? No        12/24/2017    3:27 PM  MMSE - Mini Mental State Exam  Orientation to time 5  Orientation to Place 5  Registration 3  Attention/ Calculation 5  Recall 3  Language- name 2 objects 2  Language- repeat 1  Language- follow 3 step command 3  Language- read & follow direction 1  Write a sentence 1  Copy design 1  Total score 30        06/09/2022    1:48 PM 05/05/2021   10:55 AM  6CIT Screen  What Year? 0 points 0 points  What month? 0 points 0 points  What time? 0 points 0 points  Count back from 20 0 points 0 points  Months in reverse 0 points 0 points  Repeat phrase 0 points 0 points  Total Score 0 points 0 points    Immunizations Immunization History  Administered Date(s) Administered   Fluad Quad(high Dose 65+) 05/16/2019, 05/01/2020, 05/15/2021   Influenza, High  Dose Seasonal PF 07/07/2018   Influenza,inj,Quad PF,6+ Mos 04/24/2013, 07/13/2014, 05/17/2015, 06/26/2016, 05/06/2017   Influenza-Unspecified 07/17/2009, 03/27/2010, 05/14/2011, 06/28/2012   PFIZER(Purple Top)SARS-COV-2 Vaccination 07/20/2019, 08/10/2019   Pneumococcal Conjugate-13 05/17/2015   Pneumococcal Polysaccharide-23 11/29/2017   Tdap 12/14/2014   Zoster, Live 06/17/2015    TDAP status: Up to date  Flu Vaccine status: Up to date  Pneumococcal vaccine status: Up to date  Covid-19 vaccine status: Completed vaccines  Qualifies for Shingles Vaccine? Yes   Zostavax completed No   Shingrix Completed?: No.    Education has been provided regarding the importance of this vaccine. Patient has been advised to call insurance company to determine out of pocket expense if they have not yet received this vaccine. Advised may also receive vaccine at local pharmacy or Health Dept. Verbalized acceptance and understanding.  Screening Tests Health Maintenance  Topic Date Due   Zoster Vaccines- Shingrix (1 of 2) Never done   COVID-19 Vaccine (3 - Pfizer risk series) 09/07/2019   INFLUENZA VACCINE   01/27/2022   COLONOSCOPY (Pts 45-31yr Insurance coverage will need to be confirmed)  05/25/2023   Medicare Annual Wellness (AWV)  06/10/2023   DTaP/Tdap/Td (2 - Td or Tdap) 12/13/2024   Pneumonia Vaccine 70 Years old  Completed   Hepatitis C Screening  Completed   HPV VACCINES  Aged Out    Health Maintenance  Health Maintenance Due  Topic Date Due   Zoster Vaccines- Shingrix (1 of 2) Never done   COVID-19 Vaccine (3 - Pfizer risk series) 09/07/2019   INFLUENZA VACCINE  01/27/2022    Colorectal cancer screening: Type of screening: Colonoscopy. Completed 05/24/2018. Repeat every 5 years  Lung Cancer Screening: (Low Dose CT Chest recommended if Age 387-80years, 30 pack-year currently smoking OR have quit w/in 15years.) does not qualify.   Lung Cancer Screening Referral: n/a  Additional Screening:  Hepatitis C Screening: does not qualify; Completed 05/23/2011  Vision Screening: Recommended annual ophthalmology exams for early detection of glaucoma and other disorders of the eye. Is the patient up to date with their annual eye exam?  Yes  Who is the provider or what is the name of the office in which the patient attends annual eye exams? [email or web address] If pt is not established with a provider, would they like to be referred to a provider to establish care? No .   Dental Screening: Recommended annual dental exams for proper oral hygiene  Community Resource Referral / Chronic Care Management: CRR required this visit?  No   CCM required this visit?  No      Plan:     I have personally reviewed and noted the following in the patient's chart:   Medical and social history Use of alcohol, tobacco or illicit drugs  Current medications and supplements including opioid prescriptions. Patient is not currently taking opioid prescriptions. Functional ability and status Nutritional status Physical activity Advanced directives List of other physicians Hospitalizations, surgeries, and  ER visits in previous 12 months Vitals Screenings to include cognitive, depression, and falls Referrals and appointments  In addition, I have reviewed and discussed with patient certain preventive protocols, quality metrics, and best practice recommendations. A written personalized care plan for preventive services as well as general preventive health recommendations were provided to patient.     LDaphane Shepherd LPN   [id number]  Nurse Notes: none

## 2022-06-09 NOTE — Patient Instructions (Signed)
Spencer White , Thank you for taking time to come for your Medicare Wellness Visit. I appreciate your ongoing commitment to your health goals. Please review the following plan we discussed and let me know if I can assist you in the future.   These are the goals we discussed:  Goals      Exercise 3x per week (30 min per time)        This is a list of the screening recommended for you and due dates:  Health Maintenance  Topic Date Due   Zoster (Shingles) Vaccine (1 of 2) Never done   COVID-19 Vaccine (3 - Pfizer risk series) 09/07/2019   Flu Shot  01/27/2022   Colon Cancer Screening  05/25/2023   Medicare Annual Wellness Visit  06/10/2023   DTaP/Tdap/Td vaccine (2 - Td or Tdap) 12/13/2024   Pneumonia Vaccine  Completed   Hepatitis C Screening: USPSTF Recommendation to screen - Ages 30-79 yo.  Completed   HPV Vaccine  Aged Out    Advanced directives: Advance directive discussed with you today. I have provided a copy for you to complete at home and have notarized. Once this is complete please bring a copy in to our office so we can scan it into your chart.   Conditions/risks identified: Aim for 30 minutes of exercise or brisk walking, 6-8 glasses of water, and 5 servings of fruits and vegetables each day.   Next appointment: Follow up in one year for your annual wellness visit.   Preventive Care 70 Years and Older, Male  Preventive care refers to lifestyle choices and visits with your health care provider that can promote health and wellness. What does preventive care include? A yearly physical exam. This is also called an annual well check. Dental exams once or twice a year. Routine eye exams. Ask your health care provider how often you should have your eyes checked. Personal lifestyle choices, including: Daily care of your teeth and gums. Regular physical activity. Eating a healthy diet. Avoiding tobacco and drug use. Limiting alcohol use. Practicing safe sex. Taking low doses  of aspirin every day. Taking vitamin and mineral supplements as recommended by your health care provider. What happens during an annual well check? The services and screenings done by your health care provider during your annual well check will depend on your age, overall health, lifestyle risk factors, and family history of disease. Counseling  Your health care provider may ask you questions about your: Alcohol use. Tobacco use. Drug use. Emotional well-being. Home and relationship well-being. Sexual activity. Eating habits. History of falls. Memory and ability to understand (cognition). Work and work Statistician. Screening  You may have the following tests or measurements: Height, weight, and BMI. Blood pressure. Lipid and cholesterol levels. These may be checked every 5 years, or more frequently if you are over 37 years old. Skin check. Lung cancer screening. You may have this screening every year starting at age 70 if you have a 30-pack-year history of smoking and currently smoke or have quit within the past 15 years. Fecal occult blood test (FOBT) of the stool. You may have this test every year starting at age 70. Flexible sigmoidoscopy or colonoscopy. You may have a sigmoidoscopy every 5 years or a colonoscopy every 10 years starting at age 57. Prostate cancer screening. Recommendations will vary depending on your family history and other risks. Hepatitis C blood test. Hepatitis B blood test. Sexually transmitted disease (STD) testing. Diabetes screening. This is done by checking  your blood sugar (glucose) after you have not eaten for a while (fasting). You may have this done every 1-3 years. Abdominal aortic aneurysm (AAA) screening. You may need this if you are a current or former smoker. Osteoporosis. You may be screened starting at age 70 if you are at high risk. Talk with your health care provider about your test results, treatment options, and if necessary, the need for  more tests. Vaccines  Your health care provider may recommend certain vaccines, such as: Influenza vaccine. This is recommended every year. Tetanus, diphtheria, and acellular pertussis (Tdap, Td) vaccine. You may need a Td booster every 10 years. Zoster vaccine. You may need this after age 50. Pneumococcal 13-valent conjugate (PCV13) vaccine. One dose is recommended after age 70. Pneumococcal polysaccharide (PPSV23) vaccine. One dose is recommended after age 70. Talk to your health care provider about which screenings and vaccines you need and how often you need them. This information is not intended to replace advice given to you by your health care provider. Make sure you discuss any questions you have with your health care provider. Document Released: 07/12/2015 Document Revised: 03/04/2016 Document Reviewed: 04/16/2015 Elsevier Interactive Patient Education  2017 [place] Prevention in the Home Falls can cause injuries. They can happen to people of all ages. There are many things you can do to make your home safe and to help prevent falls. What can I do on the outside of my home? Regularly fix the edges of walkways and driveways and fix any cracks. Remove anything that might make you trip as you walk through a door, such as a raised step or threshold. Trim any bushes or trees on the path to your home. Use bright outdoor lighting. Clear any walking paths of anything that might make someone trip, such as rocks or tools. Regularly check to see if handrails are loose or broken. Make sure that both sides of any steps have handrails. Any raised decks and porches should have guardrails on the edges. Have any leaves, snow, or ice cleared regularly. Use sand or salt on walking paths during winter. Clean up any spills in your garage right away. This includes oil or grease spills. What can I do in the bathroom? Use night lights. Install grab bars by the toilet and in the tub and  shower. Do not use towel bars as grab bars. Use non-skid mats or decals in the tub or shower. If you need to sit down in the shower, use a plastic, non-slip stool. Keep the floor dry. Clean up any water that spills on the floor as soon as it happens. Remove soap buildup in the tub or shower regularly. Attach bath mats securely with double-sided non-slip rug tape. Do not have throw rugs and other things on the floor that can make you trip. What can I do in the bedroom? Use night lights. Make sure that you have a light by your bed that is easy to reach. Do not use any sheets or blankets that are too big for your bed. They should not hang down onto the floor. Have a firm chair that has side arms. You can use this for support while you get dressed. Do not have throw rugs and other things on the floor that can make you trip. What can I do in the kitchen? Clean up any spills right away. Avoid walking on wet floors. Keep items that you use a lot in easy-to-reach places. If you need to reach something  above you, use a strong step stool that has a grab bar. Keep electrical cords out of the way. Do not use floor polish or wax that makes floors slippery. If you must use wax, use non-skid floor wax. Do not have throw rugs and other things on the floor that can make you trip. What can I do with my stairs? Do not leave any items on the stairs. Make sure that there are handrails on both sides of the stairs and use them. Fix handrails that are broken or loose. Make sure that handrails are as long as the stairways. Check any carpeting to make sure that it is firmly attached to the stairs. Fix any carpet that is loose or worn. Avoid having throw rugs at the top or bottom of the stairs. If you do have throw rugs, attach them to the floor with carpet tape. Make sure that you have a light switch at the top of the stairs and the bottom of the stairs. If you do not have them, ask someone to add them for  you. What else can I do to help prevent falls? Wear shoes that: Do not have high heels. Have rubber bottoms. Are comfortable and fit you well. Are closed at the toe. Do not wear sandals. If you use a stepladder: Make sure that it is fully opened. Do not climb a closed stepladder. Make sure that both sides of the stepladder are locked into place. Ask someone to hold it for you, if possible. Clearly mark and make sure that you can see: Any grab bars or handrails. First and last steps. Where the edge of each step is. Use tools that help you move around (mobility aids) if they are needed. These include: Canes. Walkers. Scooters. Crutches. Turn on the lights when you go into a dark area. Replace any light bulbs as soon as they burn out. Set up your furniture so you have a clear path. Avoid moving your furniture around. If any of your floors are uneven, fix them. If there are any pets around you, be aware of where they are. Review your medicines with your doctor. Some medicines can make you feel dizzy. This can increase your chance of falling. Ask your doctor what other things that you can do to help prevent falls. This information is not intended to replace advice given to you by your health care provider. Make sure you discuss any questions you have with your health care provider. Document Released: 04/11/2009 Document Revised: 11/21/2015 Document Reviewed: 07/20/2014 Elsevier Interactive Patient Education  2017 [employer].

## 2022-07-09 ENCOUNTER — Encounter: Payer: Medicare HMO | Admitting: Family Medicine

## 2022-07-24 ENCOUNTER — Ambulatory Visit (INDEPENDENT_AMBULATORY_CARE_PROVIDER_SITE_OTHER): Payer: Medicare HMO | Admitting: Family Medicine

## 2022-07-24 ENCOUNTER — Encounter: Payer: Self-pay | Admitting: Family Medicine

## 2022-07-24 VITALS — BP 129/74 | HR 56 | Ht 73.0 in | Wt 222.0 lb

## 2022-07-24 DIAGNOSIS — E7800 Pure hypercholesterolemia, unspecified: Secondary | ICD-10-CM

## 2022-07-24 DIAGNOSIS — F339 Major depressive disorder, recurrent, unspecified: Secondary | ICD-10-CM | POA: Diagnosis not present

## 2022-07-24 DIAGNOSIS — R69 Illness, unspecified: Secondary | ICD-10-CM | POA: Diagnosis not present

## 2022-07-24 DIAGNOSIS — Z0001 Encounter for general adult medical examination with abnormal findings: Secondary | ICD-10-CM | POA: Diagnosis not present

## 2022-07-24 DIAGNOSIS — F411 Generalized anxiety disorder: Secondary | ICD-10-CM

## 2022-07-24 DIAGNOSIS — Z0000 Encounter for general adult medical examination without abnormal findings: Secondary | ICD-10-CM

## 2022-07-24 DIAGNOSIS — F419 Anxiety disorder, unspecified: Secondary | ICD-10-CM

## 2022-07-24 DIAGNOSIS — Z23 Encounter for immunization: Secondary | ICD-10-CM

## 2022-07-24 DIAGNOSIS — I10 Essential (primary) hypertension: Secondary | ICD-10-CM

## 2022-07-24 MED ORDER — VENLAFAXINE HCL ER 75 MG PO CP24: 75.0000 mg | ORAL_CAPSULE | Freq: Every day | ORAL | 3 refills | Status: DC

## 2022-07-24 NOTE — Progress Notes (Signed)
BP 129/74   Pulse (!) 56   Ht '6\' 1"'$  (1.854 m)   Wt 222 lb (100.7 kg)   SpO2 99%   BMI 29.29 kg/m    Subjective:   Patient ID: Spencer White, male    DOB: 1950/09/17, 72 y.o.   MRN: [id number]  HPI: Spencer White is a 72 y.o. male presenting on 07/24/2022 for Medical Management of Chronic Issues (CPE) and Anxiety   HPI Physical exam Patient denies any chest pain, shortness of breath, headaches or vision issues, abdominal complaints, diarrhea, nausea, vomiting, or joint issues.   Hypertension Patient is currently on no medicine currently, and their blood pressure today is 129/74. Patient denies any lightheadedness or dizziness. Patient denies headaches, blurred vision, chest pains, shortness of breath, or weakness. Denies any side effects from medication and is content with current medication.   Hyperlipidemia Patient is coming in for recheck of his hyperlipidemia. The patient is currently taking no medicine currently, trying diet. They deny any issues with myalgias or history of liver damage from it. They deny any focal numbness or weakness or chest pain.   Anxiety and depression recheck Patient is coming in for anxiety and depression recheck.  He is currently on Effexor.  He feels like he is doing really well and wants to work on tapering down.  He is going to do 150 and 75 mg every other day until he gets to just be on the 75 mg tablets.    07/24/2022    2:49 PM 07/24/2022    2:48 PM 06/09/2022    1:46 PM 12/15/2021    2:53 PM 05/05/2021   10:41 AM  Depression screen PHQ 2/9  Decreased Interest  0 0 0 1  Down, Depressed, Hopeless  0 0 0 0  PHQ - 2 Score  0 0 0 1  Altered sleeping 0      Tired, decreased energy 0      Change in appetite 0      Feeling bad or failure about yourself  0      Trouble concentrating 0      Moving slowly or fidgety/restless 0      Suicidal thoughts 0      Difficult doing work/chores Not difficult at all         Relevant past medical,  surgical, family and social history reviewed and updated as indicated. Interim medical history since our last visit reviewed. Allergies and medications reviewed and updated.  Review of Systems  Constitutional:  Negative for chills and fever.  HENT:  Negative for ear pain and tinnitus.   Eyes:  Negative for pain and visual disturbance.  Respiratory:  Negative for cough, shortness of breath and wheezing.   Cardiovascular:  Negative for chest pain, palpitations and leg swelling.  Gastrointestinal:  Negative for abdominal pain, blood in stool, constipation and diarrhea.  Genitourinary:  Negative for dysuria and hematuria.  Musculoskeletal:  Negative for back pain, gait problem and myalgias.  Skin:  Negative for rash.  Neurological:  Negative for dizziness, weakness, light-headedness and headaches.  Psychiatric/Behavioral:  Negative for suicidal ideas.   All other systems reviewed and are negative.   Per HPI unless specifically indicated above   Allergies as of 07/24/2022       Reactions   Latex Other (See Comments)   Gets redness from band-aids        Medication List        Accurate as of July 24, 2022  3:26 PM. If you have any questions, ask your nurse or doctor.          multivitamin with minerals Tabs tablet Take 1 tablet by mouth daily.   timolol 0.25 % ophthalmic solution Commonly known as: TIMOPTIC Place 1 drop into both eyes 2 (two) times daily.   venlafaxine XR 75 MG 24 hr capsule Commonly known as: EFFEXOR-XR Take 1 capsule (75 mg total) by mouth daily with breakfast. What changed:  medication strength how much to take Changed by: Fransisca Kaufmann Arletta Lumadue, MD         Objective:   BP 129/74   Pulse (!) 56   Ht '6\' 1"'$  (1.854 m)   Wt 222 lb (100.7 kg)   SpO2 99%   BMI 29.29 kg/m   Wt Readings from Last 3 Encounters:  07/24/22 222 lb (100.7 kg)  06/09/22 215 lb (97.5 kg)  12/15/21 228 lb 12.8 oz (103.8 kg)    Physical Exam Vitals and nursing note  reviewed.  Constitutional:      General: He is not in acute distress.    Appearance: He is well-developed. He is not diaphoretic.  HENT:     Right Ear: External ear normal.     Left Ear: External ear normal.     Nose: Nose normal.     Mouth/Throat:     Pharynx: No oropharyngeal exudate.  Eyes:     General: No scleral icterus.    Conjunctiva/sclera: Conjunctivae normal.  Neck:     Thyroid: No thyromegaly.  Cardiovascular:     Rate and Rhythm: Normal rate and regular rhythm.     Heart sounds: Normal heart sounds. No murmur heard. Pulmonary:     Effort: Pulmonary effort is normal. No respiratory distress.     Breath sounds: Normal breath sounds. No wheezing.  Abdominal:     General: Bowel sounds are normal. There is no distension.     Palpations: Abdomen is soft.     Tenderness: There is no abdominal tenderness. There is no guarding or rebound.  Genitourinary:    Prostate: Enlarged. Not tender and no nodules present.  Musculoskeletal:        General: No swelling. Normal range of motion.     Cervical back: Neck supple.  Lymphadenopathy:     Cervical: No cervical adenopathy.  Skin:    General: Skin is warm and dry.     Findings: No rash.  Neurological:     Mental Status: He is alert and oriented to person, place, and time.     Coordination: Coordination normal.  Psychiatric:        Behavior: Behavior normal.       Assessment & Plan:   Problem List Items Addressed This Visit       Cardiovascular and Mediastinum   Essential hypertension   Relevant Orders   NMR, lipoprofile   CBC with Differential/Platelet   CMP14+EGFR   PSA, total and free   TSH   VITAMIN D 25 Hydroxy (Vit-D Deficiency, Fractures)   Vitamin B12     Other   Depression, recurrent (HCC)   Relevant Medications   venlafaxine XR (EFFEXOR-XR) 75 MG 24 hr capsule   Other Relevant Orders   NMR, lipoprofile   CBC with Differential/Platelet   CMP14+EGFR   PSA, total and free   TSH   VITAMIN D 25  Hydroxy (Vit-D Deficiency, Fractures)   Vitamin B12   Hyperlipidemia   Relevant Orders   NMR, lipoprofile   CBC with  Differential/Platelet   CMP14+EGFR   PSA, total and free   TSH   VITAMIN D 25 Hydroxy (Vit-D Deficiency, Fractures)   Vitamin B12   GAD (generalized anxiety disorder)   Relevant Medications   venlafaxine XR (EFFEXOR-XR) 75 MG 24 hr capsule   Other Relevant Orders   NMR, lipoprofile   CBC with Differential/Platelet   CMP14+EGFR   PSA, total and free   TSH   VITAMIN D 25 Hydroxy (Vit-D Deficiency, Fractures)   Vitamin B12   Other Visit Diagnoses     Well adult exam    -  Primary   Relevant Orders   NMR, lipoprofile   CBC with Differential/Platelet   CMP14+EGFR   PSA, total and free   TSH   VITAMIN D 25 Hydroxy (Vit-D Deficiency, Fractures)   Vitamin B12   Anxiety       Relevant Medications   venlafaxine XR (EFFEXOR-XR) 75 MG 24 hr capsule   Other Relevant Orders   NMR, lipoprofile   CBC with Differential/Platelet   CMP14+EGFR   PSA, total and free   TSH   VITAMIN D 25 Hydroxy (Vit-D Deficiency, Fractures)   Vitamin B12   Need for shingles vaccine       Relevant Orders   Zoster Recombinant (Shingrix ) (Completed)       Continue current medicine, does want to do lower on the Effexor at 75, will taper by taking every other day in the 150 and 75 until he gets down to just taking the 75, taper over 1 week.  Once vitamin testing and specific lipid testing, is willing to pay if needed. Follow up plan: Return in about 6 months (around 01/22/2023), or if symptoms worsen or fail to improve, for Recheck anxiety.  Counseling provided for all of the vaccine components Orders Placed This Encounter  Procedures   Zoster Recombinant (Shingrix )   NMR, lipoprofile   CBC with Differential/Platelet   CMP14+EGFR   PSA, total and free   TSH   VITAMIN D 25 Hydroxy (Vit-D Deficiency, Fractures)   Vitamin B12    Caryl Pina, MD [facility] 07/24/2022, 3:26 PM

## 2022-08-07 ENCOUNTER — Other Ambulatory Visit: Payer: Medicare HMO

## 2022-08-07 DIAGNOSIS — Z125 Encounter for screening for malignant neoplasm of prostate: Secondary | ICD-10-CM | POA: Diagnosis not present

## 2022-08-07 DIAGNOSIS — F419 Anxiety disorder, unspecified: Secondary | ICD-10-CM

## 2022-08-07 DIAGNOSIS — Z0000 Encounter for general adult medical examination without abnormal findings: Secondary | ICD-10-CM | POA: Diagnosis not present

## 2022-08-07 DIAGNOSIS — I10 Essential (primary) hypertension: Secondary | ICD-10-CM

## 2022-08-07 DIAGNOSIS — E7800 Pure hypercholesterolemia, unspecified: Secondary | ICD-10-CM

## 2022-08-07 DIAGNOSIS — R69 Illness, unspecified: Secondary | ICD-10-CM | POA: Diagnosis not present

## 2022-08-07 DIAGNOSIS — F339 Major depressive disorder, recurrent, unspecified: Secondary | ICD-10-CM

## 2022-08-07 DIAGNOSIS — R6889 Other general symptoms and signs: Secondary | ICD-10-CM | POA: Diagnosis not present

## 2022-08-07 DIAGNOSIS — F411 Generalized anxiety disorder: Secondary | ICD-10-CM

## 2022-08-08 LAB — CBC WITH DIFFERENTIAL/PLATELET
Basophils Absolute: 0 10*3/uL (ref 0.0–0.2)
Basos: 1 %
EOS (ABSOLUTE): 0.3 10*3/uL (ref 0.0–0.4)
Eos: 5 %
Hematocrit: 44.5 % (ref 37.5–51.0)
Hemoglobin: 15 g/dL (ref 13.0–17.7)
Immature Grans (Abs): 0 10*3/uL (ref 0.0–0.1)
Immature Granulocytes: 0 %
Lymphocytes Absolute: 1.9 10*3/uL (ref 0.7–3.1)
Lymphs: 34 %
MCH: 33 pg (ref 26.6–33.0)
MCHC: 33.7 g/dL (ref 31.5–35.7)
MCV: 98 fL — ABNORMAL HIGH (ref 79–97)
Monocytes Absolute: 0.5 10*3/uL (ref 0.1–0.9)
Monocytes: 8 %
Neutrophils Absolute: 2.8 10*3/uL (ref 1.4–7.0)
Neutrophils: 52 %
Platelets: 288 10*3/uL (ref 150–450)
RBC: 4.55 x10E6/uL (ref 4.14–5.80)
RDW: 12.2 % (ref 11.6–15.4)
WBC: 5.4 10*3/uL (ref 3.4–10.8)

## 2022-08-08 LAB — NMR, LIPOPROFILE
Cholesterol, Total: 215 mg/dL — ABNORMAL HIGH (ref 100–199)
HDL Particle Number: 32.3 umol/L (ref >=30.5)
HDL-C: 49 mg/dL (ref >39)
LDL Particle Number: 1944 nmol/L — ABNORMAL HIGH (ref <1000)
LDL Size: 20.9 nm (ref >20.5)
LDL-C (NIH Calc): 146 mg/dL — ABNORMAL HIGH (ref 0–99)
LP-IR Score: 65 — ABNORMAL HIGH (ref <=45)
Small LDL Particle Number: 940 nmol/L — ABNORMAL HIGH (ref <=527)
Triglycerides: 110 mg/dL (ref 0–149)

## 2022-08-08 LAB — CMP14+EGFR
ALT: 23 IU/L (ref 0–44)
AST: 22 IU/L (ref 0–40)
Albumin/Globulin Ratio: 2 (ref 1.2–2.2)
Albumin: 4.1 g/dL (ref 3.9–4.9)
Alkaline Phosphatase: 66 IU/L (ref 44–121)
BUN/Creatinine Ratio: 13 (ref 10–24)
BUN: 15 mg/dL (ref 8–27)
Bilirubin Total: 0.5 mg/dL (ref 0.0–1.2)
CO2: 23 mmol/L (ref 20–29)
Calcium: 9.2 mg/dL (ref 8.6–10.2)
Chloride: 106 mmol/L (ref 96–106)
Creatinine, Ser: 1.13 mg/dL (ref 0.76–1.27)
Globulin, Total: 2.1 g/dL (ref 1.5–4.5)
Glucose: 91 mg/dL (ref 70–99)
Potassium: 4.7 mmol/L (ref 3.5–5.2)
Sodium: 143 mmol/L (ref 134–144)
Total Protein: 6.2 g/dL (ref 6.0–8.5)
eGFR: 70 mL/min/{1.73_m2} (ref >59)

## 2022-08-08 LAB — VITAMIN D 25 HYDROXY (VIT D DEFICIENCY, FRACTURES): Vit D, 25-Hydroxy: 74.8 ng/mL (ref 30.0–100.0)

## 2022-08-08 LAB — PSA, TOTAL AND FREE
PSA, Free: <0.02 ng/mL
Prostate Specific Ag, Serum: <0.1 ng/mL (ref 0.0–4.0)

## 2022-08-08 LAB — VITAMIN B12: Vitamin B-12: 499 pg/mL (ref 232–1245)

## 2022-08-08 LAB — TSH: TSH: 2.01 u[IU]/mL (ref 0.450–4.500)

## 2022-09-22 DIAGNOSIS — H52223 Regular astigmatism, bilateral: Secondary | ICD-10-CM | POA: Diagnosis not present

## 2022-09-22 DIAGNOSIS — H401131 Primary open-angle glaucoma, bilateral, mild stage: Secondary | ICD-10-CM | POA: Diagnosis not present

## 2022-09-22 DIAGNOSIS — H5203 Hypermetropia, bilateral: Secondary | ICD-10-CM | POA: Diagnosis not present

## 2022-09-22 DIAGNOSIS — H524 Presbyopia: Secondary | ICD-10-CM | POA: Diagnosis not present

## 2022-11-09 ENCOUNTER — Ambulatory Visit (INDEPENDENT_AMBULATORY_CARE_PROVIDER_SITE_OTHER): Payer: Medicare HMO

## 2022-11-09 DIAGNOSIS — Z23 Encounter for immunization: Secondary | ICD-10-CM

## 2022-11-09 NOTE — Progress Notes (Signed)
Shingle vaccine given left deltoid - patient tolerated well

## 2023-01-11 DIAGNOSIS — C4441 Basal cell carcinoma of skin of scalp and neck: Secondary | ICD-10-CM | POA: Diagnosis not present

## 2023-01-11 DIAGNOSIS — L821 Other seborrheic keratosis: Secondary | ICD-10-CM | POA: Diagnosis not present

## 2023-01-11 DIAGNOSIS — L820 Inflamed seborrheic keratosis: Secondary | ICD-10-CM | POA: Diagnosis not present

## 2023-01-11 DIAGNOSIS — D1801 Hemangioma of skin and subcutaneous tissue: Secondary | ICD-10-CM | POA: Diagnosis not present

## 2023-01-11 DIAGNOSIS — D225 Melanocytic nevi of trunk: Secondary | ICD-10-CM | POA: Diagnosis not present

## 2023-01-22 ENCOUNTER — Telehealth (INDEPENDENT_AMBULATORY_CARE_PROVIDER_SITE_OTHER): Payer: Medicare HMO | Admitting: Family Medicine

## 2023-01-22 NOTE — Progress Notes (Signed)
Patient is going to reschedule appt in person.

## 2023-03-23 DIAGNOSIS — H401131 Primary open-angle glaucoma, bilateral, mild stage: Secondary | ICD-10-CM | POA: Diagnosis not present

## 2023-05-03 DIAGNOSIS — L821 Other seborrheic keratosis: Secondary | ICD-10-CM | POA: Diagnosis not present

## 2023-05-03 DIAGNOSIS — Z85828 Personal history of other malignant neoplasm of skin: Secondary | ICD-10-CM | POA: Diagnosis not present

## 2023-05-03 DIAGNOSIS — C4441 Basal cell carcinoma of skin of scalp and neck: Secondary | ICD-10-CM | POA: Diagnosis not present

## 2023-05-03 DIAGNOSIS — X32XXXD Exposure to sunlight, subsequent encounter: Secondary | ICD-10-CM | POA: Diagnosis not present

## 2023-05-03 DIAGNOSIS — D225 Melanocytic nevi of trunk: Secondary | ICD-10-CM | POA: Diagnosis not present

## 2023-05-03 DIAGNOSIS — Z08 Encounter for follow-up examination after completed treatment for malignant neoplasm: Secondary | ICD-10-CM | POA: Diagnosis not present

## 2023-05-03 DIAGNOSIS — L570 Actinic keratosis: Secondary | ICD-10-CM | POA: Diagnosis not present

## 2023-05-19 DIAGNOSIS — H401131 Primary open-angle glaucoma, bilateral, mild stage: Secondary | ICD-10-CM | POA: Diagnosis not present

## 2023-05-19 DIAGNOSIS — H25813 Combined forms of age-related cataract, bilateral: Secondary | ICD-10-CM | POA: Diagnosis not present

## 2023-05-19 DIAGNOSIS — H5203 Hypermetropia, bilateral: Secondary | ICD-10-CM | POA: Diagnosis not present

## 2023-05-20 ENCOUNTER — Encounter: Payer: Self-pay | Admitting: Internal Medicine

## 2023-06-11 DIAGNOSIS — C4441 Basal cell carcinoma of skin of scalp and neck: Secondary | ICD-10-CM | POA: Diagnosis not present

## 2023-06-26 ENCOUNTER — Encounter: Payer: Self-pay | Admitting: Family Medicine

## 2023-07-14 DIAGNOSIS — C4441 Basal cell carcinoma of skin of scalp and neck: Secondary | ICD-10-CM | POA: Diagnosis not present

## 2023-08-06 DIAGNOSIS — H401131 Primary open-angle glaucoma, bilateral, mild stage: Secondary | ICD-10-CM | POA: Diagnosis not present

## 2023-08-10 ENCOUNTER — Other Ambulatory Visit: Payer: Self-pay | Admitting: Family Medicine

## 2023-08-10 ENCOUNTER — Telehealth: Payer: Self-pay

## 2023-08-10 DIAGNOSIS — F339 Major depressive disorder, recurrent, unspecified: Secondary | ICD-10-CM

## 2023-08-10 DIAGNOSIS — F411 Generalized anxiety disorder: Secondary | ICD-10-CM

## 2023-08-10 DIAGNOSIS — F419 Anxiety disorder, unspecified: Secondary | ICD-10-CM

## 2023-08-10 DIAGNOSIS — I10 Essential (primary) hypertension: Secondary | ICD-10-CM

## 2023-08-10 DIAGNOSIS — E7800 Pure hypercholesterolemia, unspecified: Secondary | ICD-10-CM

## 2023-08-10 NOTE — Telephone Encounter (Signed)
Appt scheduled for 09/17/22 please send refill to pharm

## 2023-08-10 NOTE — Addendum Note (Signed)
Addended by: Julious Payer D on: 08/10/2023 01:07 PM   Modules accepted: Orders

## 2023-08-10 NOTE — Telephone Encounter (Signed)
Copied from CRM [id number]. Topic: Clinical - Medical Advice >> Aug 10, 2023  1:13 PM Clayton Bibles wrote: Reason for CRM: He wants to know if he should get him blood work done before his March 21 appointment. He thought his doctor could review results at his appointment

## 2023-08-10 NOTE — Telephone Encounter (Signed)
[facility] NTBS last OV 07/24/22 NO RF sent to pharmacy last OV greater than a year

## 2023-08-11 MED ORDER — VENLAFAXINE HCL ER 75 MG PO CP24: 75.0000 mg | ORAL_CAPSULE | Freq: Every day | ORAL | 0 refills | Status: DC

## 2023-08-11 NOTE — Telephone Encounter (Signed)
Please advise and place any future lab orders, had labs done on 08/07/22

## 2023-08-11 NOTE — Telephone Encounter (Signed)
Yes please order a CBC a CMP and lipid and PSA

## 2023-08-11 NOTE — Telephone Encounter (Signed)
Patient needs an appointment, make sure he has 1 scheduled and then we can give him enough to get through to that appointment but it has been a year since he seen me.

## 2023-08-11 NOTE — Telephone Encounter (Signed)
Future lab orders placed. Pt made aware to call back to schedule a lab appt

## 2023-08-11 NOTE — Addendum Note (Signed)
Addended by: Dorene Sorrow on: 08/11/2023 05:00 PM   Modules accepted: Orders

## 2023-09-17 ENCOUNTER — Ambulatory Visit: Payer: Medicare HMO | Admitting: Family Medicine

## 2023-09-17 VITALS — BP 136/78 | HR 57 | Temp 98.0°F | Ht 73.0 in | Wt 208.6 lb

## 2023-09-17 DIAGNOSIS — E7800 Pure hypercholesterolemia, unspecified: Secondary | ICD-10-CM

## 2023-09-17 DIAGNOSIS — F339 Major depressive disorder, recurrent, unspecified: Secondary | ICD-10-CM

## 2023-09-17 DIAGNOSIS — F419 Anxiety disorder, unspecified: Secondary | ICD-10-CM | POA: Diagnosis not present

## 2023-09-17 DIAGNOSIS — F411 Generalized anxiety disorder: Secondary | ICD-10-CM | POA: Diagnosis not present

## 2023-09-17 DIAGNOSIS — I10 Essential (primary) hypertension: Secondary | ICD-10-CM | POA: Diagnosis not present

## 2023-09-17 DIAGNOSIS — Z0001 Encounter for general adult medical examination with abnormal findings: Secondary | ICD-10-CM

## 2023-09-17 DIAGNOSIS — B001 Herpesviral vesicular dermatitis: Secondary | ICD-10-CM | POA: Diagnosis not present

## 2023-09-17 DIAGNOSIS — Z0000 Encounter for general adult medical examination without abnormal findings: Secondary | ICD-10-CM

## 2023-09-17 MED ORDER — VENLAFAXINE HCL ER 75 MG PO CP24: 75.0000 mg | ORAL_CAPSULE | Freq: Every day | ORAL | 3 refills | Status: AC

## 2023-09-17 MED ORDER — VALACYCLOVIR HCL 1 G PO TABS: 1000.0000 mg | ORAL_TABLET | Freq: Two times a day (BID) | ORAL | 0 refills | Status: AC

## 2023-09-17 NOTE — Progress Notes (Signed)
 BP (!) 158/79   Pulse (!) 57   Temp 98 F (36.7 C)   Ht 6\' 1"  (1.854 m)   Wt 208 lb 9.6 oz (94.6 kg)   SpO2 97%   BMI 27.52 kg/m    Subjective:   Patient ID: Spencer White, male    DOB: [birth year], 73 y.o.   MRN: [id number]  HPI: Spencer White is a 74 y.o. male presenting on 09/17/2023 for Medical Management of Chronic Issues (Physical )   HPI Physical exam Patient denies any chest pain, shortness of breath, headaches or vision issues, abdominal complaints, diarrhea, nausea, vomiting, or joint issues.   Hypertension Patient is currently on no patient currently, monitoring, and their blood pressure today is 158/79. Patient denies any lightheadedness or dizziness. Patient denies headaches, blurred vision, chest pains, shortness of breath, or weakness. Denies any side effects from medication and is content with current medication.   Hyperlipidemia Patient is coming in for recheck of his hyperlipidemia. The patient is currently taking no medication currently, trying diet control. They deny any issues with myalgias or history of liver damage from it. They deny any focal numbness or weakness or chest pain.   Depression and anxiety recheck Patient is currently taking Effexor for anxiety and depression.  He feels like he is doing really well with anxiety and depression.  He feels like the Effexor is doing well and that he is going to continue forward with the medicine.  He said 1 time he tried to taper himself off of it and it did not go well and he wants to continue with the    09/17/2023    1:40 PM 07/24/2022    2:49 PM 07/24/2022    2:48 PM 06/09/2022    1:46 PM 12/15/2021    2:53 PM  Depression screen PHQ 2/9  Decreased Interest 0  0 0 0  Down, Depressed, Hopeless 0  0 0 0  PHQ - 2 Score 0  0 0 0  Altered sleeping 0 0     Tired, decreased energy 0 0     Change in appetite 0 0     Feeling bad or failure about yourself  0 0     Trouble concentrating 0 0     Moving slowly or  fidgety/restless 0 0     Suicidal thoughts 0 0     PHQ-9 Score 0      Difficult doing work/chores Not difficult at all Not difficult at all        Relevant past medical, surgical, family and social history reviewed and updated as indicated. Interim medical history since our last visit reviewed. Allergies and medications reviewed and updated.  Review of Systems  Constitutional:  Negative for chills and fever.  Eyes:  Negative for visual disturbance.  Respiratory:  Negative for shortness of breath and wheezing.   Cardiovascular:  Negative for chest pain and leg swelling.  Musculoskeletal:  Negative for back pain and gait problem.  Skin:  Negative for rash.  Neurological:  Negative for dizziness, weakness and light-headedness.  All other systems reviewed and are negative.   Per HPI unless specifically indicated above   Allergies as of 09/17/2023       Reactions   Latex Other (See Comments)   Gets redness from band-aids        Medication List        Accurate as of September 17, 2023  2:07 PM. If you have any questions, ask  your nurse or doctor.          latanoprost 0.005 % ophthalmic solution Commonly known as: XALATAN Place 1 drop into both eyes at bedtime.   multivitamin with minerals Tabs tablet Take 1 tablet by mouth daily.   timolol 0.25 % ophthalmic solution Commonly known as: TIMOPTIC Place 1 drop into both eyes 2 (two) times daily.   valACYclovir 1000 MG tablet Commonly known as: VALTREX Take 1 tablet (1,000 mg total) by mouth 2 (two) times daily for 10 days. Started by: Elige Radon Lenetta Piche   venlafaxine XR 75 MG 24 hr capsule Commonly known as: EFFEXOR-XR Take 1 capsule (75 mg total) by mouth daily with breakfast.         Objective:   BP (!) 158/79   Pulse (!) 57   Temp 98 F (36.7 C)   Ht 6\' 1"  (1.854 m)   Wt 208 lb 9.6 oz (94.6 kg)   SpO2 97%   BMI 27.52 kg/m   Wt Readings from Last 3 Encounters:  09/17/23 208 lb 9.6 oz (94.6 kg)   07/24/22 222 lb (100.7 kg)  06/09/22 215 lb (97.5 kg)    Physical Exam Vitals and nursing note reviewed.  Constitutional:      General: He is not in acute distress.    Appearance: He is well-developed. He is not diaphoretic.  Eyes:     General: No scleral icterus.    Conjunctiva/sclera: Conjunctivae normal.  Neck:     Thyroid: No thyromegaly.  Cardiovascular:     Rate and Rhythm: Normal rate and regular rhythm.     Heart sounds: Normal heart sounds. No murmur heard. Pulmonary:     Effort: Pulmonary effort is normal. No respiratory distress.     Breath sounds: Normal breath sounds. No wheezing.  Musculoskeletal:        General: Normal range of motion.     Cervical back: Neck supple.  Lymphadenopathy:     Cervical: No cervical adenopathy.  Skin:    General: Skin is warm and dry.     Findings: No rash.  Neurological:     Mental Status: He is alert and oriented to person, place, and time.     Coordination: Coordination normal.  Psychiatric:        Behavior: Behavior normal.       Assessment & Plan:   Problem List Items Addressed This Visit       Cardiovascular and Mediastinum   Essential hypertension     Other   Depression, recurrent (HCC)   Relevant Medications   venlafaxine XR (EFFEXOR-XR) 75 MG 24 hr capsule   Other Relevant Orders   CBC with Differential/Platelet   CMP14+EGFR   Hyperlipidemia   Relevant Orders   NMR LipoProfile+Lipids+IR   CBC with Differential/Platelet   CMP14+EGFR   GAD (generalized anxiety disorder)   Relevant Medications   venlafaxine XR (EFFEXOR-XR) 75 MG 24 hr capsule   Other Visit Diagnoses       Physical exam    -  Primary   Relevant Orders   NMR LipoProfile+Lipids+IR   CBC with Differential/Platelet   CMP14+EGFR   PSA, total and free     Anxiety       Relevant Medications   venlafaxine XR (EFFEXOR-XR) 75 MG 24 hr capsule     Cold sore       Relevant Medications   valACYclovir (VALTREX) 1000 MG tablet       No  change in medication, blood pressure was  elevated today and he is get a monitor it closely over the next 2 weeks and let me know if it is running up at home.  He gets the occasional cold sore so we will send him into [facility] just in case. Follow up plan: Return in about 1 year (around 09/16/2024), or if symptoms worsen or fail to improve, for Physical exam and hypertension and hyperlipidemia.  Counseling provided for all of the vaccine components Orders Placed This Encounter  Procedures   NMR LipoProfile+Lipids+IR   CBC with Differential/Platelet   CMP14+EGFR   PSA, total and free    Arville Care, MD [facility] 09/17/2023, 2:07 PM

## 2023-09-21 ENCOUNTER — Ambulatory Visit: Payer: Self-pay

## 2023-09-21 NOTE — Telephone Encounter (Signed)
 Copied from CRM [id number]. Topic: Clinical - Red Word Triage >> Sep 21, 2023  4:29 PM Elle L wrote: Red Word that prompted transfer to Nurse Triage: The patient had a cat bite and puncture his finger over the weekend and it is raised, swollen and sore to the touch.   Chief Complaint: Cat bite  Symptoms: Area of bite red, swollen, and sore  Frequency: Single bite  Disposition: [] ED /[] Urgent Care (no appt availability in office) / [x] Appointment(In office/virtual)/ []  [facility]/ [] Home Care/ [] Refused Recommended Disposition /[] [facility]/ []  Follow-up with PCP Additional Notes: Patient reports he recently took in a feral cat and was bit 2 days ago while trying to give it medicine. He states that the cat was vaccinated for rabies approximately 1.5 weeks ago. He states he was bit on a finger on his left hand and that the bite is a pencil dot sized puncture wound. He state the area around the bite is swollen, red, and painful. Patient's last tetanus shot was in 2016. Appointment made for the patient tomorrow.      Reason for Disposition  [1] Last tetanus shot > 5 years ago AND [2] any wound (e.g., cut, scrape)  Answer Assessment - Initial Assessment Questions 1. ANIMAL: "What type of animal caused the bite?" "Is the injury from a bite or a claw?" If the animal is a dog or a cat, ask: "Was it a pet or a stray?" "Was it acting ill or behaving strangely?"     Cat 2. LOCATION: "Where is the bite located?"      Left hand finger  3. SIZE: "How big is the bite?" "What does it look like?"      Pencil dot  4. ONSET: "When did the bite happen?" (Minutes or hours ago)      3 days ago  5. CIRCUMSTANCES: "Tell me how this happened."      Was giving him medicine  6. TETANUS: "When was your last tetanus booster?"     2016 7. RABIES VACCINE: For dog or cat bites, ask: "Do you know if the pet is vaccinated against rabies?"  (e.g., yes, no, overdue for rabies shot, unknown)     Was  vaccinated prior to bite  Protocols used: Animal Bite-A-AH

## 2023-09-22 ENCOUNTER — Ambulatory Visit (INDEPENDENT_AMBULATORY_CARE_PROVIDER_SITE_OTHER): Admitting: Nurse Practitioner

## 2023-09-22 ENCOUNTER — Encounter: Payer: Self-pay | Admitting: Nurse Practitioner

## 2023-09-22 VITALS — BP 136/73 | HR 56 | Temp 97.1°F | Ht 73.0 in | Wt 208.4 lb

## 2023-09-22 DIAGNOSIS — W5501XA Bitten by cat, initial encounter: Secondary | ICD-10-CM | POA: Diagnosis not present

## 2023-09-22 DIAGNOSIS — Z23 Encounter for immunization: Secondary | ICD-10-CM

## 2023-09-22 DIAGNOSIS — M79601 Pain in right arm: Secondary | ICD-10-CM

## 2023-09-22 MED ORDER — AMOXICILLIN-POT CLAVULANATE 875-125 MG PO TABS: 1.0000 | ORAL_TABLET | Freq: Two times a day (BID) | ORAL | 0 refills | Status: DC

## 2023-09-22 NOTE — Addendum Note (Signed)
 Addended by: Threasa Heads on: 09/22/2023 10:29 AM   Modules accepted: Orders

## 2023-09-22 NOTE — Patient Instructions (Signed)
Animal Bite, Adult Animal bites can be mild or serious. Small bites from house pets normally are mild. Bites from cats, strays, or wild animals can be serious. If a stray or wild animal bites you, you need to get medical help right away. You may also need a shot to prevent rabies infection. What increases the risk? Being near pets you do not know. Being near animals that are eating, sleeping, or caring for their babies. Being outside where small, wild animals move freely. What are the signs or symptoms? Pain. Bleeding. Swelling. Bruising. How is this treated? Treatment may include: Cleaning your wound. Rinsing out (flushing) your wound. This uses saline solution, which is made of salt and water. Putting a bandage on your wound. Closing your wound with stitches (sutures), staples, skin glue, or skin tape (adhesive strips). Antibiotic medicine. You may be given pills, cream, gel, or fluid through an IV. A tetanus shot. Rabies treatment, if the animal could have rabies. Surgery, if there is infection or damage that needs to be fixed. Follow these instructions at home: Medicines Take or apply over-the-counter and prescription medicines only as told by your doctor. If you were prescribed an antibiotic medicine, take or apply it as told by your doctor. Do not stop using it even if your wound gets better. Wound care  Follow instructions from your doctor about how to take care of your wound. Make sure you: Wash your hands with soap and water for at least 20 seconds before and after you change your bandage. If you cannot use soap and water, use hand sanitizer. Change your bandage. Leave stitches or skin glue in place for at least 2 weeks. Leave tape strips alone unless you are told to take them off. You may trim the edges of the tape strips if they curl up. Check your wound every day for signs of infection. Check for: More redness, swelling, or pain. More fluid or blood. Warmth. Pus or a  bad smell. General instructions  Raise (elevate) the injured area above the level of your heart while you are sitting or lying down. If told, put ice on the injured area. To do this: Put ice in a plastic bag. Place a towel between your skin and the bag. Leave the ice on for 20 minutes, 2-3 times per day. Take off the ice if your skin turns bright red. This is very important. If you cannot feel pain, heat, or cold, you have a greater risk of damage to the area. Keep all follow-up visits. Contact a doctor if: You have more redness, swelling, or pain around your wound. Your wound feels warm to the touch. You have a fever or chills. You have a general feeling of sickness (malaise). You feel like you may vomit. You vomit. You have pain that does not get better. Get help right away if: You have a red streak going away from your wound. You have any of these coming from your wound: Non-clear fluid. More blood. Pus or a bad smell. You have trouble moving your injured area. You lose feeling (have numbness) or feel tingling anywhere on your body. Summary Animal bites can be mild or serious. If a stray or wild animal bites you, you need to get medical help right away. Your doctor will look at the wound and may ask about how the animal bite happened. Treatment may include wound care, antibiotic medicine, a tetanus shot, and rabies treatment. This information is not intended to replace advice given to you   by your health care provider. Make sure you discuss any questions you have with your health care provider. Document Revised: 06/20/2021 Document Reviewed: 06/20/2021 Elsevier Patient Education  2024 [employer]  

## 2023-09-22 NOTE — Progress Notes (Signed)
   Subjective:    Patient ID: Spencer White, male    DOB: 1951/02/27, 73 y.o.   MRN: [id number]   Chief Complaint: Animal Bite (Pt was bit by stray cat, cat is up to date with all shots.)   Animal Bite  Pertinent negatives include no chest pain, no abdominal pain, no headaches and no weakness.    Patient captured a stray cat . When he brought it home it bit and scratched his right arm. Area is looking infected. Last TD was 2016. Patient Active Problem List   Diagnosis Date Noted   BMI 28.0-28.9,adult 06/05/2019   Pulmonary nodules 11/29/2017   Family history of lung cancer 11/29/2017   Elevated coronary artery calcium score 11/10/2016   Essential hypertension 11/10/2016   Organic impotence 03/28/2014   GAD (generalized anxiety disorder) 03/28/2014   History of prostate cancer 07/12/2013   Depression, recurrent (HCC) 07/12/2013   Hyperlipidemia 07/12/2013       Review of Systems  Constitutional:  Negative for diaphoresis.  Eyes:  Negative for pain.  Respiratory:  Negative for shortness of breath.   Cardiovascular:  Negative for chest pain, palpitations and leg swelling.  Gastrointestinal:  Negative for abdominal pain.  Endocrine: Negative for polydipsia.  Skin:  Negative for rash.  Neurological:  Negative for dizziness, weakness and headaches.  Hematological:  Does not bruise/bleed easily.  All other systems reviewed and are negative.      Objective:   Physical Exam Constitutional:      Appearance: Normal appearance.  Cardiovascular:     Rate and Rhythm: Normal rate and regular rhythm.     Heart sounds: Normal heart sounds.  Pulmonary:     Effort: Pulmonary effort is normal.     Breath sounds: Normal breath sounds.  Skin:    Comments: Puncture wound to left middle finger and superficial scraps to left forearm.  Neurological:     General: No focal deficit present.     Mental Status: He is oriented to person, place, and time.  Psychiatric:        Mood and  Affect: Mood normal.        Behavior: Behavior normal.    BP 136/73   Pulse (!) 56   Temp (!) 97.1 F (36.2 C)   Ht 6\' 1"  (1.854 m)   Wt 208 lb 6.4 oz (94.5 kg)   SpO2 99%   BMI 27.50 kg/m         Assessment & Plan:  Spencer White in today with chief complaint of Animal Bite (Pt was bit by stray cat, cat is up to date with all shots.)   1. Cat bite, initial encounter (Primary) Soak areas in epsom salt bid Take all of antibiotics TD shot today - amoxicillin-clavulanate (AUGMENTIN) 875-125 MG tablet; Take 1 tablet by mouth 2 (two) times daily.  Dispense: 20 tablet; Refill: 0    The above assessment and management plan was discussed with the patient. The patient verbalized understanding of and has agreed to the management plan. Patient is aware to call the clinic if symptoms persist or worsen. Patient is aware when to return to the clinic for a follow-up visit. Patient educated on when it is appropriate to go to the emergency department.   Mary-Margaret Daphine Deutscher, FNP

## 2023-09-24 ENCOUNTER — Other Ambulatory Visit

## 2023-09-24 DIAGNOSIS — E7800 Pure hypercholesterolemia, unspecified: Secondary | ICD-10-CM

## 2023-09-24 DIAGNOSIS — Z0000 Encounter for general adult medical examination without abnormal findings: Secondary | ICD-10-CM

## 2023-09-24 DIAGNOSIS — F339 Major depressive disorder, recurrent, unspecified: Secondary | ICD-10-CM

## 2023-09-26 LAB — CBC WITH DIFFERENTIAL/PLATELET
Basophils Absolute: 0 10*3/uL (ref 0.0–0.2)
Basos: 1 %
EOS (ABSOLUTE): 0.2 10*3/uL (ref 0.0–0.4)
Eos: 5 %
Hematocrit: 46 % (ref 37.5–51.0)
Hemoglobin: 15.6 g/dL (ref 13.0–17.7)
Immature Grans (Abs): 0 10*3/uL (ref 0.0–0.1)
Immature Granulocytes: 0 %
Lymphocytes Absolute: 1.2 10*3/uL (ref 0.7–3.1)
Lymphs: 30 %
MCH: 33.9 pg — ABNORMAL HIGH (ref 26.6–33.0)
MCHC: 33.9 g/dL (ref 31.5–35.7)
MCV: 100 fL — ABNORMAL HIGH (ref 79–97)
Monocytes Absolute: 0.3 10*3/uL (ref 0.1–0.9)
Monocytes: 9 %
Neutrophils Absolute: 2.1 10*3/uL (ref 1.4–7.0)
Neutrophils: 55 %
Platelets: 283 10*3/uL (ref 150–450)
RBC: 4.6 x10E6/uL (ref 4.14–5.80)
RDW: 12 % (ref 11.6–15.4)
WBC: 3.9 10*3/uL (ref 3.4–10.8)

## 2023-09-26 LAB — CMP14+EGFR
ALT: 17 IU/L (ref 0–44)
AST: 20 IU/L (ref 0–40)
Albumin: 4.3 g/dL (ref 3.8–4.8)
Alkaline Phosphatase: 72 IU/L (ref 44–121)
BUN/Creatinine Ratio: 15 (ref 10–24)
BUN: 15 mg/dL (ref 8–27)
Bilirubin Total: 0.5 mg/dL (ref 0.0–1.2)
CO2: 24 mmol/L (ref 20–29)
Calcium: 9.5 mg/dL (ref 8.6–10.2)
Chloride: 104 mmol/L (ref 96–106)
Creatinine, Ser: 1 mg/dL (ref 0.76–1.27)
Globulin, Total: 2 g/dL (ref 1.5–4.5)
Glucose: 98 mg/dL (ref 70–99)
Potassium: 4.9 mmol/L (ref 3.5–5.2)
Sodium: 139 mmol/L (ref 134–144)
Total Protein: 6.3 g/dL (ref 6.0–8.5)
eGFR: 80 mL/min/{1.73_m2} (ref >59)

## 2023-09-26 LAB — NMR LIPOPROFILE+LIPIDS+IR
Cholesterol, Total: 193 mg/dL (ref 100–199)
HDL Particle Number: 32.5 umol/L (ref >=30.5)
HDL Size: 8.3 nm — ABNORMAL LOW (ref >=9.2)
HDL-C: 42 mg/dL (ref >39)
LDL Particle Number: 1700 nmol/L — ABNORMAL HIGH (ref <1000)
LDL Size: 20.9 nm (ref >20.5)
LDL Size: 20.9 nm (ref >=20.8)
LDL-C (NIH Calc): 136 mg/dL — ABNORMAL HIGH (ref 0–99)
LP-IR Score: 54 — ABNORMAL HIGH (ref <=45)
Large HDL-P: 2.9 umol/L — ABNORMAL LOW (ref >=4.8)
Large VLDL-P: 1.5 nmol/L (ref <=2.7)
Small LDL Particle Number: 846 nmol/L — ABNORMAL HIGH (ref <=527)
Small LDL-P: 846 nmol/L — ABNORMAL HIGH (ref <=527)
Triglycerides: 79 mg/dL (ref 0–149)
VLDL Size: 42.5 nm (ref <=46.6)

## 2023-09-26 LAB — PSA, TOTAL AND FREE
PSA, Free: <0.02 ng/mL
Prostate Specific Ag, Serum: <0.1 ng/mL (ref 0.0–4.0)

## 2023-10-06 ENCOUNTER — Encounter: Payer: Self-pay | Admitting: Family Medicine

## 2023-11-16 ENCOUNTER — Encounter: Payer: Self-pay | Admitting: Internal Medicine

## 2023-12-07 ENCOUNTER — Telehealth: Payer: Self-pay | Admitting: Family Medicine

## 2023-12-07 NOTE — Telephone Encounter (Unsigned)
 Copied from CRM [id number]. Topic: Appointments - Appointment Info/Confirmation >> Dec 07, 2023  3:47 PM Sophia H wrote:  Patient is calling in regarding his AWV / Physical scheduled for 09/19/2024. The patient also has a physical scheduled for 09/18/2024 and he is needing clarification on this, I show the physical scheduled for 03/24 is with a different provider but the patient is requesting to see Dr. Steen Eden. I don't show any availability for the provider on 03/24, please advise the patient of his options.

## 2023-12-10 DIAGNOSIS — H401131 Primary open-angle glaucoma, bilateral, mild stage: Secondary | ICD-10-CM | POA: Diagnosis not present

## 2024-01-12 ENCOUNTER — Encounter: Payer: Self-pay | Admitting: Family Medicine

## 2024-01-12 ENCOUNTER — Ambulatory Visit: Admitting: Family Medicine

## 2024-01-12 ENCOUNTER — Ambulatory Visit

## 2024-01-12 VITALS — BP 129/70 | HR 58 | Temp 97.8°F | Ht 73.0 in | Wt 208.0 lb

## 2024-01-12 DIAGNOSIS — H6121 Impacted cerumen, right ear: Secondary | ICD-10-CM | POA: Diagnosis not present

## 2024-01-12 NOTE — Progress Notes (Signed)
 Chief Complaint  Patient presents with   Cerumen Impaction    Left ear muffled and full feeling. No pain. No hearing loss. Started a week ago. Used a cotton swab.     HPI  Patient presents today for sensation of fullness in the  right ear.  It has impeded hearing.  Patient does not wear hearing aids.  Usually has no significant hearing loss.  Denies signs and symptoms of infection including fever redness swelling and discharge from the ear.  The left ear is normal, symptom-free.  PMH: Smoking status noted Review of Systems  HENT:  Positive for hearing loss (muffled AD).     Objective: BP 129/70   Pulse (!) 58   Temp 97.8 F (36.6 C)   Ht 6' 1 (1.854 m)   Wt 208 lb (94.3 kg)   SpO2 97%   BMI 27.44 kg/m  Gen: NAD, alert, cooperative with exam HEENT: NCAT, EOMI, PERRL Right EAC occluded wth cerumen. Left is clear. Neuro: Alert and oriented, No gross deficits  Impacted cerumen of right ear   Ear lavage AD performed.  Symptoms resolved and EAC clear. TM nml.

## 2024-01-16 ENCOUNTER — Encounter: Payer: Self-pay | Admitting: Family Medicine

## 2024-01-18 DIAGNOSIS — D225 Melanocytic nevi of trunk: Secondary | ICD-10-CM | POA: Diagnosis not present

## 2024-01-18 DIAGNOSIS — X32XXXD Exposure to sunlight, subsequent encounter: Secondary | ICD-10-CM | POA: Diagnosis not present

## 2024-01-18 DIAGNOSIS — C4441 Basal cell carcinoma of skin of scalp and neck: Secondary | ICD-10-CM | POA: Diagnosis not present

## 2024-01-18 DIAGNOSIS — L821 Other seborrheic keratosis: Secondary | ICD-10-CM | POA: Diagnosis not present

## 2024-01-18 DIAGNOSIS — Z08 Encounter for follow-up examination after completed treatment for malignant neoplasm: Secondary | ICD-10-CM | POA: Diagnosis not present

## 2024-01-18 DIAGNOSIS — Z85828 Personal history of other malignant neoplasm of skin: Secondary | ICD-10-CM | POA: Diagnosis not present

## 2024-01-18 DIAGNOSIS — L570 Actinic keratosis: Secondary | ICD-10-CM | POA: Diagnosis not present

## 2024-01-21 ENCOUNTER — Ambulatory Visit (AMBULATORY_SURGERY_CENTER)

## 2024-01-21 ENCOUNTER — Encounter: Payer: Self-pay | Admitting: Internal Medicine

## 2024-01-21 VITALS — Ht 73.0 in | Wt 209.0 lb

## 2024-01-21 DIAGNOSIS — Z860100 Personal history of colon polyps, unspecified: Secondary | ICD-10-CM

## 2024-01-21 MED ORDER — NA SULFATE-K SULFATE-MG SULF 17.5-3.13-1.6 GM/177ML PO SOLN: 1.0000 | Freq: Once | ORAL | 0 refills | Status: AC

## 2024-01-21 NOTE — Progress Notes (Signed)
 No egg or soy allergy known to patient  No issues known to pt with past sedation with any surgeries or procedures Patient denies ever being told they had issues or difficulty with intubation  No FH of Malignant Hyperthermia Pt is not on diet pills nor GLP-1 medications Pt is not on  home 02  Pt is not on blood thinners  Pt denies issues with chronic constipation  No A fib or A flutter Have any cardiac testing pending--no Pt instructed to use Singlecare.[email or web address] or GoodRx for a price reduction on prep  Ambulates independently

## 2024-02-08 ENCOUNTER — Ambulatory Visit: Admitting: Internal Medicine

## 2024-02-08 ENCOUNTER — Encounter: Payer: Self-pay | Admitting: Internal Medicine

## 2024-02-08 VITALS — BP 123/60 | HR 50 | Temp 97.5°F | Resp 9 | Ht 73.0 in | Wt 209.0 lb

## 2024-02-08 DIAGNOSIS — K5730 Diverticulosis of large intestine without perforation or abscess without bleeding: Secondary | ICD-10-CM

## 2024-02-08 DIAGNOSIS — Z1211 Encounter for screening for malignant neoplasm of colon: Secondary | ICD-10-CM

## 2024-02-08 DIAGNOSIS — Z860101 Personal history of adenomatous and serrated colon polyps: Secondary | ICD-10-CM

## 2024-02-08 DIAGNOSIS — D123 Benign neoplasm of transverse colon: Secondary | ICD-10-CM

## 2024-02-08 DIAGNOSIS — K648 Other hemorrhoids: Secondary | ICD-10-CM | POA: Diagnosis not present

## 2024-02-08 DIAGNOSIS — F32A Depression, unspecified: Secondary | ICD-10-CM | POA: Diagnosis not present

## 2024-02-08 DIAGNOSIS — Z860100 Personal history of colon polyps, unspecified: Secondary | ICD-10-CM

## 2024-02-08 DIAGNOSIS — D124 Benign neoplasm of descending colon: Secondary | ICD-10-CM

## 2024-02-08 MED ORDER — SODIUM CHLORIDE 0.9 % IV SOLN: 500.0000 mL | Freq: Once | INTRAVENOUS | Status: DC

## 2024-02-08 NOTE — Progress Notes (Signed)
 GASTROENTEROLOGY PROCEDURE H&P NOTE   Primary Care Physician: Dettinger, Fonda LABOR, MD    Reason for Procedure:  History of adenomatous colon polyps  Plan:    Colonoscopy  Patient is appropriate for endoscopic procedure(s) in the ambulatory ([facility]) setting.  The nature of the procedure, as well as the risks, benefits, and alternatives were carefully and thoroughly reviewed with the patient. Ample time for discussion and questions allowed. The patient understood, was satisfied, and agreed to proceed.     HPI: Spencer White is a 73 y.o. male who presents for surveillance colonoscopy.  Medical history as below.  Tolerated the prep.  No recent chest pain or shortness of breath.  No abdominal pain today.  Past Medical History:  Diagnosis Date   Allergy    seasonal allergies   Asthma    as a child   BCC (basal cell carcinoma), scalp/neck    Depression    Diverticulosis    Duodenitis    Hiatal hernia    Hyperlipidemia    Prostate cancer (HCC) 2011   Schatzki's ring    Status post dilation of esophageal narrowing    Tubular adenoma of colon     Past Surgical History:  Procedure Laterality Date   COLONOSCOPY     PROSTATECTOMY  07/2009   TONSILLECTOMY      Prior to Admission medications   Medication Sig Start Date End Date Taking? Authorizing Provider  fexofenadine (ALLEGRA ODT) 30 MG disintegrating tablet Take 30 mg by mouth daily.   Yes [provider]  latanoprost (XALATAN) 0.005 % ophthalmic solution Place 1 drop into both eyes at bedtime. 09/22/22  Yes [provider]  Multiple Vitamin (MULTIVITAMIN WITH MINERALS) TABS tablet Take 1 tablet by mouth daily.   Yes [provider]  timolol (TIMOPTIC) 0.25 % ophthalmic solution Place 1 drop into both eyes 2 (two) times daily. Patient taking differently: Place 1 drop into both eyes in the morning. 10/01/20  Yes [provider]  venlafaxine  XR (EFFEXOR -XR) 75 MG 24 hr capsule Take 1 capsule  (75 mg total) by mouth daily with breakfast. 09/17/23  Yes Dettinger, Fonda LABOR, MD    Current Outpatient Medications  Medication Sig Dispense Refill   fexofenadine (ALLEGRA ODT) 30 MG disintegrating tablet Take 30 mg by mouth daily.     latanoprost (XALATAN) 0.005 % ophthalmic solution Place 1 drop into both eyes at bedtime.     Multiple Vitamin (MULTIVITAMIN WITH MINERALS) TABS tablet Take 1 tablet by mouth daily.     timolol (TIMOPTIC) 0.25 % ophthalmic solution Place 1 drop into both eyes 2 (two) times daily. (Patient taking differently: Place 1 drop into both eyes in the morning.)     venlafaxine  XR (EFFEXOR -XR) 75 MG 24 hr capsule Take 1 capsule (75 mg total) by mouth daily with breakfast. 90 capsule 3   Current Facility-Administered Medications  Medication Dose Route Frequency Provider Last Rate Last Admin   0.9 %  sodium chloride  infusion  500 mL Intravenous Once Myrtle Barnhard, Gordy HERO, MD        Allergies as of 02/08/2024 - Review Complete 02/08/2024  Allergen Reaction Noted   Latex Other (See Comments) 09/18/2014    Family History  Problem Relation Age of Onset   Lung cancer Mother    Lung cancer Brother    Heart disease Father 48       Angioplasty stent   Hypertension Father    AAA (abdominal aortic aneurysm) Father    Cancer  Maternal Grandmother        bladder   Asthma Paternal Grandmother    Colon cancer Neg Hx    Colon polyps Neg Hx    Kidney disease Neg Hx    Gallbladder disease Neg Hx    Esophageal cancer Neg Hx    Diabetes Neg Hx    Stomach cancer Neg Hx    Rectal cancer Neg Hx     Social History   Socioeconomic History   Marital status: Divorced    Spouse name: Not on file   Number of children: 0   Years of education: Not on file   Highest education level: Bachelor's degree (e.g., BA, AB, BS)  Occupational History   Occupation: Educational psychologist  Tobacco Use   Smoking status: Never   Smokeless tobacco: Never  Vaping Use   Vaping status: Never Used   Substance and Sexual Activity   Alcohol use: Yes    Alcohol/week: 2.0 standard drinks of alcohol    Types: 2 Glasses of wine per week    Comment: 1 glass red wine 1-2 a week   Drug use: No   Sexual activity: Not on file  Other Topics Concern   Not on file  Social History Narrative   Lives alone but has 6 cats and a dog.   Supportive brother and sister in law live close by.   Social Drivers of Corporate investment banker Strain: Low Risk  (09/17/2023)   Overall Financial Resource Strain (CARDIA)    Difficulty of Paying Living Expenses: Not hard at all  Food Insecurity: No Food Insecurity (09/17/2023)   Hunger Vital Sign    Worried About Running Out of Food in the Last Year: Never true    Ran Out of Food in the Last Year: Never true  Transportation Needs: No Transportation Needs (09/17/2023)   PRAPARE - Administrator, Civil Service (Medical): No    Lack of Transportation (Non-Medical): No  Physical Activity: Sufficiently Active (09/17/2023)   Exercise Vital Sign    Days of Exercise per Week: 3 days    Minutes of Exercise per Session: 60 min  Stress: No Stress Concern Present (09/17/2023)   [employer] of Occupational Health - Occupational Stress Questionnaire    Feeling of Stress : Not at all  Social Connections: Moderately Isolated (09/17/2023)   Social Connection and Isolation Panel    Frequency of Communication with Friends and Family: Once a week    Frequency of Social Gatherings with Friends and Family: Once a week    Attends Religious Services: More than 4 times per year    Active Member of [employer] or Organizations: Yes    Attends Engineer, structural: More than 4 times per year    Marital Status: Divorced  Intimate Partner Violence: Not At Risk (06/09/2022)   Humiliation, Afraid, Rape, and Kick questionnaire    Fear of Current or Ex-Partner: No    Emotionally Abused: No    Physically Abused: No    Sexually Abused: No    Physical Exam: Vital  signs in last 24 hours: @BP  (!) 144/81   Pulse (!) 57   Temp (!) 97.5 F (36.4 C) (Temporal)   Ht 6' 1 (1.854 m)   Wt 209 lb (94.8 kg)   SpO2 100%   BMI 27.57 kg/m  GEN: NAD EYE: Sclerae anicteric ENT: MMM CV: Non-tachycardic Pulm: CTA b/l GI: Soft, NT/ND NEURO:  Alert & Oriented x 3  Gordy Starch, MD [facility]  02/08/2024 10:30 AM

## 2024-02-08 NOTE — Progress Notes (Signed)
 PT taken to PACU. Monitors in place. VSS. Report given to RN.

## 2024-02-08 NOTE — Op Note (Signed)
 [facility] Patient Name: Spencer White Procedure Date: 02/08/2024 10:31 AM MRN: [id number] Endoscopist: Gordy CHRISTELLA Starch , MD, [id number] Age: 73 Referring MD:  Date of Birth: [birth year] Gender: Male Account #: [id number] Procedure:                Colonoscopy Indications:              High risk colon cancer surveillance: Personal                            history of multiple (3 or more) adenomas, Last                            colonoscopy: November 2019 (no polyps), 2016 (TA x                            7) Medicines:                Monitored Anesthesia Care Procedure:                Pre-Anesthesia Assessment:                           - Prior to the procedure, a History and Physical                            was performed, and patient medications and                            allergies were reviewed. The patient's tolerance of                            previous anesthesia was also reviewed. The risks                            and benefits of the procedure and the sedation                            options and risks were discussed with the patient.                            All questions were answered, and informed consent                            was obtained. Prior Anticoagulants: The patient has                            taken no anticoagulant or antiplatelet agents. ASA                            Grade Assessment: II - A patient with mild systemic                            disease. After reviewing the risks and benefits,  the patient was deemed in satisfactory condition to                            undergo the procedure.                           After obtaining informed consent, the colonoscope                            was passed under direct vision. Throughout the                            procedure, the patient's blood pressure, pulse, and                            oxygen saturations were monitored continuously. The                             CF [id number] #[id number] was introduced through the anus                            and advanced to the cecum, identified by                            appendiceal orifice and ileocecal valve. The                            colonoscopy was performed without difficulty. The                            patient tolerated the procedure well. The quality                            of the bowel preparation was good. The ileocecal                            valve, appendiceal orifice, and rectum were                            photographed. Scope In: 10:41:09 AM Scope Out: 10:59:37 AM Scope Withdrawal Time: 0 hours 14 minutes 3 seconds  Total Procedure Duration: 0 hours 18 minutes 28 seconds  Findings:                 The digital rectal exam was normal.                           Two sessile polyps were found in the transverse                            colon. The polyps were 2 to 3 mm in size. These                            polyps were removed with a cold snare. Resection  and retrieval were complete.                           Two sessile polyps were found in the descending                            colon. The polyps were 3 to 4 mm in size. These                            polyps were removed with a cold snare. Resection                            and retrieval were complete.                           Multiple medium-mouthed and small-mouthed                            diverticula were found in the sigmoid colon.                           Internal hemorrhoids were found during                            retroflexion. The hemorrhoids were medium-sized. Complications:            No immediate complications. Estimated Blood Loss:     Estimated blood loss was minimal. Impression:               - Two 2 to 3 mm polyps in the transverse colon,                            removed with a cold snare. Resected and retrieved.                           - Two 3 to 4 mm  polyps in the descending colon,                            removed with a cold snare. Resected and retrieved.                           - Moderate diverticulosis in the sigmoid colon.                           - Internal hemorrhoids. Recommendation:           - Patient has a contact number available for                            emergencies. The signs and symptoms of potential                            delayed complications were discussed with the  patient. Return to normal activities tomorrow.                            Written discharge instructions were provided to the                            patient.                           - Resume previous diet.                           - Continue present medications.                           - Await pathology results.                           - Repeat colonoscopy is recommended for                            surveillance. The colonoscopy date will be                            determined after pathology results from today's                            exam become available for review. Gordy CHRISTELLA Starch, MD 02/08/2024 11:02:59 AM This report has been signed electronically.

## 2024-02-08 NOTE — Patient Instructions (Signed)
 Resume previous diet. Continue present medications. Awaiting pathology results. Repeat colonoscopy is recommended for surveillance. The colonoscopy date will be determined after pathology results from today's exam.  Handouts provided on polyps, diverticulosis, and hemorrhoids.   YOU HAD AN ENDOSCOPIC PROCEDURE TODAY AT THE [facility]:   Refer to the procedure report that was given to you for any specific questions about what was found during the examination.  If the procedure report does not answer your questions, please call your gastroenterologist to clarify.  If you requested that your care partner not be given the details of your procedure findings, then the procedure report has been included in a sealed envelope for you to review at your convenience later.  YOU SHOULD EXPECT: Some feelings of bloating in the abdomen. Passage of more gas than usual.  Walking can help get rid of the air that was put into your GI tract during the procedure and reduce the bloating. If you had a lower endoscopy (such as a colonoscopy or flexible sigmoidoscopy) you may notice spotting of blood in your stool or on the toilet paper. If you underwent a bowel prep for your procedure, you may not have a normal bowel movement for a few days.  Please Note:  You might notice some irritation and congestion in your nose or some drainage.  This is from the oxygen used during your procedure.  There is no need for concern and it should clear up in a day or so.  SYMPTOMS TO REPORT IMMEDIATELY:  Following lower endoscopy (colonoscopy or flexible sigmoidoscopy):  Excessive amounts of blood in the stool  Significant tenderness or worsening of abdominal pains  Swelling of the abdomen that is new, acute  Fever of 100F or higher  For urgent or emergent issues, a gastroenterologist can be reached at any hour by calling [phone]. Do not use MyChart messaging for urgent concerns.    DIET:  We do recommend a  small meal at first, but then you may proceed to your regular diet.  Drink plenty of fluids but you should avoid alcoholic beverages for 24 hours.  ACTIVITY:  You should plan to take it easy for the rest of today and you should NOT DRIVE or use heavy machinery until tomorrow (because of the sedation medicines used during the test).    FOLLOW UP: Our staff will call the number listed on your records the next business day following your procedure.  We will call around 7:15- 8:00 am to check on you and address any questions or concerns that you may have regarding the information given to you following your procedure. If we do not reach you, we will leave a message.     If any biopsies were taken you will be contacted by phone or by letter within the next 1-3 weeks.  Please call us  at [phone] if you have not heard about the biopsies in 3 weeks.    SIGNATURES/CONFIDENTIALITY: You and/or your care partner have signed paperwork which will be entered into your electronic medical record.  These signatures attest to the fact that that the information above on your After Visit Summary has been reviewed and is understood.  Full responsibility of the confidentiality of this discharge information lies with you and/or your care-partner.

## 2024-02-08 NOTE — Progress Notes (Signed)
 Called to room to assist during endoscopic procedure.  Patient ID and intended procedure confirmed with present staff. Received instructions for my participation in the procedure from the performing physician.

## 2024-02-09 ENCOUNTER — Telehealth: Payer: Self-pay | Admitting: *Deleted

## 2024-02-09 NOTE — Telephone Encounter (Signed)
  Follow up Call-     02/08/2024    9:51 AM  Call back number  Post procedure Call Back phone  # [phone]  Permission to leave phone message Yes     Patient questions:  Do you have a fever, pain , or abdominal swelling? No. Pain Score  0 *  Have you tolerated food without any problems? Yes.    Have you been able to return to your normal activities? Yes.    Do you have any questions about your discharge instructions: Diet   No. Medications  No. Follow up visit  No.  Do you have questions or concerns about your Care? No.  Actions: * If pain score is 4 or above: No action needed, pain <4.

## 2024-02-10 DIAGNOSIS — C4441 Basal cell carcinoma of skin of scalp and neck: Secondary | ICD-10-CM | POA: Diagnosis not present

## 2024-02-10 LAB — SURGICAL PATHOLOGY

## 2024-02-15 ENCOUNTER — Ambulatory Visit: Payer: Self-pay | Admitting: Internal Medicine

## 2024-05-09 DIAGNOSIS — Z85828 Personal history of other malignant neoplasm of skin: Secondary | ICD-10-CM | POA: Diagnosis not present

## 2024-05-09 DIAGNOSIS — L820 Inflamed seborrheic keratosis: Secondary | ICD-10-CM | POA: Diagnosis not present

## 2024-05-09 DIAGNOSIS — Z08 Encounter for follow-up examination after completed treatment for malignant neoplasm: Secondary | ICD-10-CM | POA: Diagnosis not present

## 2024-05-19 DIAGNOSIS — H5203 Hypermetropia, bilateral: Secondary | ICD-10-CM | POA: Diagnosis not present

## 2024-07-05 ENCOUNTER — Ambulatory Visit

## 2024-09-18 ENCOUNTER — Encounter: Payer: Self-pay | Admitting: Family Medicine

## 2024-09-19 ENCOUNTER — Ambulatory Visit: Payer: Self-pay

## 2024-09-19 ENCOUNTER — Encounter: Admitting: Nurse Practitioner
